# Patient Record
Sex: Male | Born: 1950 | Race: White | Hispanic: No | Smoking: Current every day smoker
Health system: Southern US, Community
[De-identification: ages and names within clinical notes are randomized; demographics above are authoritative.]

## PROBLEM LIST (undated history)

## (undated) DIAGNOSIS — J449 Chronic obstructive pulmonary disease, unspecified: Secondary | ICD-10-CM

## (undated) DIAGNOSIS — I00 Rheumatic fever without heart involvement: Secondary | ICD-10-CM

## (undated) DIAGNOSIS — I10 Essential (primary) hypertension: Secondary | ICD-10-CM

## (undated) DIAGNOSIS — J189 Pneumonia, unspecified organism: Secondary | ICD-10-CM

## (undated) HISTORY — PX: CARDIAC CATHETERIZATION: SHX172

## (undated) HISTORY — DX: Essential (primary) hypertension: I10

## (undated) HISTORY — DX: Rheumatic fever without heart involvement: I00

---

## 2009-01-19 ENCOUNTER — Observation Stay (HOSPITAL_COMMUNITY): Admission: EM | Admit: 2009-01-19 | Discharge: 2009-01-23 | Payer: Self-pay | Admitting: Emergency Medicine

## 2009-01-19 ENCOUNTER — Ambulatory Visit: Payer: Self-pay | Admitting: Family Medicine

## 2009-01-20 ENCOUNTER — Ambulatory Visit: Payer: Self-pay | Admitting: Dentistry

## 2009-01-31 ENCOUNTER — Encounter: Admission: AD | Admit: 2009-01-31 | Discharge: 2009-01-31 | Payer: Self-pay | Admitting: Dentistry

## 2009-03-02 ENCOUNTER — Ambulatory Visit: Payer: Self-pay | Admitting: Dentistry

## 2009-04-28 ENCOUNTER — Ambulatory Visit: Payer: Self-pay | Admitting: Dentistry

## 2009-06-22 ENCOUNTER — Ambulatory Visit: Payer: Self-pay | Admitting: Dentistry

## 2009-08-22 ENCOUNTER — Ambulatory Visit: Payer: Self-pay | Admitting: Dentistry

## 2010-10-22 LAB — POCT I-STAT, CHEM 8
BUN: 9 mg/dL (ref 6–23)
Calcium, Ion: 1.02 mmol/L — ABNORMAL LOW (ref 1.12–1.32)
Chloride: 98 mEq/L (ref 96–112)
Creatinine, Ser: 1.1 mg/dL (ref 0.4–1.5)
Glucose, Bld: 117 mg/dL — ABNORMAL HIGH (ref 70–99)
HCT: 46 % (ref 39.0–52.0)
Hemoglobin: 15.6 g/dL (ref 13.0–17.0)
Potassium: 3.6 mEq/L (ref 3.5–5.1)
Sodium: 131 mEq/L — ABNORMAL LOW (ref 135–145)
TCO2: 24 mmol/L (ref 0–100)

## 2010-10-22 LAB — BASIC METABOLIC PANEL
BUN: 2 mg/dL — ABNORMAL LOW (ref 6–23)
BUN: 4 mg/dL — ABNORMAL LOW (ref 6–23)
BUN: 6 mg/dL (ref 6–23)
CO2: 27 mEq/L (ref 19–32)
CO2: 27 mEq/L (ref 19–32)
Calcium: 8.4 mg/dL (ref 8.4–10.5)
Calcium: 8.6 mg/dL (ref 8.4–10.5)
Chloride: 105 mEq/L (ref 96–112)
Creatinine, Ser: 0.9 mg/dL (ref 0.4–1.5)
Creatinine, Ser: 0.91 mg/dL (ref 0.4–1.5)
Creatinine, Ser: 0.95 mg/dL (ref 0.4–1.5)
GFR calc non Af Amer: >60 mL/min (ref >60)
Glucose, Bld: 104 mg/dL — ABNORMAL HIGH (ref 70–99)
Glucose, Bld: 119 mg/dL — ABNORMAL HIGH (ref 70–99)
Potassium: 4.1 mEq/L (ref 3.5–5.1)

## 2010-10-22 LAB — COMPREHENSIVE METABOLIC PANEL
ALT: 36 U/L (ref 0–53)
BUN: 11 mg/dL (ref 6–23)
CO2: 28 mEq/L (ref 19–32)
Calcium: 8.4 mg/dL (ref 8.4–10.5)
GFR calc non Af Amer: >60 mL/min (ref >60)
Glucose, Bld: 90 mg/dL (ref 70–99)
Total Protein: 7.6 g/dL (ref 6.0–8.3)

## 2010-10-22 LAB — URINALYSIS, MICROSCOPIC ONLY
Glucose, UA: NEGATIVE mg/dL
Leukocytes, UA: NEGATIVE
Protein, ur: 30 mg/dL — AB
Specific Gravity, Urine: 1.029 (ref 1.005–1.030)
Urobilinogen, UA: 0.2 mg/dL (ref 0.0–1.0)

## 2010-10-22 LAB — CBC
HCT: 37.3 % — ABNORMAL LOW (ref 39.0–52.0)
HCT: 40 % (ref 39.0–52.0)
Hemoglobin: 13.9 g/dL (ref 13.0–17.0)
Hemoglobin: 14.9 g/dL (ref 13.0–17.0)
Hemoglobin: 15.4 g/dL (ref 13.0–17.0)
MCHC: 34.7 g/dL (ref 30.0–36.0)
MCHC: 34.8 g/dL (ref 30.0–36.0)
MCHC: 34.9 g/dL (ref 30.0–36.0)
MCV: 96.7 fL (ref 78.0–100.0)
MCV: 97.2 fL (ref 78.0–100.0)
MCV: 97.3 fL (ref 78.0–100.0)
Platelets: 361 10*3/uL (ref 150–400)
Platelets: 382 10*3/uL (ref 150–400)
Platelets: 390 10*3/uL (ref 150–400)
RBC: 4.13 MIL/uL — ABNORMAL LOW (ref 4.22–5.81)
RBC: 4.64 MIL/uL (ref 4.22–5.81)
RDW: 12.2 % (ref 11.5–15.5)
RDW: 12.7 % (ref 11.5–15.5)
RDW: 12.7 % (ref 11.5–15.5)
RDW: 12.8 % (ref 11.5–15.5)
RDW: 12.9 % (ref 11.5–15.5)

## 2010-10-22 LAB — CULTURE, BLOOD (ROUTINE X 2)
Culture: NO GROWTH
Culture: NO GROWTH

## 2010-10-22 LAB — HEMOGLOBIN A1C
Hgb A1c MFr Bld: 5.3 % (ref 4.6–6.1)
Mean Plasma Glucose: 105 mg/dL

## 2010-10-22 LAB — DIFFERENTIAL
Basophils Relative: 0 % (ref 0–1)
Basophils Relative: 0 % (ref 0–1)
Eosinophils Absolute: 0 10*3/uL (ref 0.0–0.7)
Eosinophils Absolute: 0.3 10*3/uL (ref 0.0–0.7)
Lymphocytes Relative: 7 % — ABNORMAL LOW (ref 12–46)
Lymphs Abs: 1.5 10*3/uL (ref 0.7–4.0)
Monocytes Absolute: 1.3 10*3/uL — ABNORMAL HIGH (ref 0.1–1.0)
Monocytes Relative: 9 % (ref 3–12)
Neutro Abs: 13.6 10*3/uL — ABNORMAL HIGH (ref 1.7–7.7)
Neutrophils Relative %: 80 % — ABNORMAL HIGH (ref 43–77)
Neutrophils Relative %: 87 % — ABNORMAL HIGH (ref 43–77)

## 2010-10-22 LAB — PROTIME-INR: INR: 1 (ref 0.00–1.49)

## 2010-11-28 NOTE — Discharge Summary (Signed)
Logan Green, Logan Green              ACCOUNT NO.:  [id number]   MEDICAL RECORD NO.:  [id number]          PATIENT TYPE:  OBV   LOCATION:  5155                         FACILITY:  [facility]   PHYSICIAN:  Leighton Roach McDiarmid, M.D.DATE OF BIRTH:  1951-01-23   DATE OF ADMISSION:  01/18/2009  DATE OF DISCHARGE:  01/23/2009                               DISCHARGE SUMMARY   PRIMARY CARE PHYSICIAN:  Unassigned.   CONSULTANTS:  Charlynne Pander, D.D.S. (Dental).   REASON FOR ADMISSION:  Facial cellulitis.   PRIMARY DISCHARGE DIAGNOSES:  1. Facial cellulitis.  2. Dental decay.  3. Hypertension.   NEW MEDICATIONS ON DISCHARGE:  1. Motrin 400 mg by mouth every 4 hours as needed for pain.  2. Percocet 1-2 tablets by mouth every 6 hours as needed for pain.  3. Clindamycin 300 mg by mouth 3 times a day x5 days.   PROBLEMS:  1. Facial cellulitis.  On arrival to the emergency department, the      patient had profound facial cellulitis, redness and swelling of      nose and cheeks.  This had started on January 18, 2009, per patient's      report.  On further assessment of the patient's mouth, diffuse      dental decay was noted.  The most likely cause of the facial      cellulitis is infection that seeded from dental abscess.  The      patient was started on vancomycin IV and also treated with      clindamycin IV, the cellulitis improved with this treatment.  The      patient was seen by Dr. Kristin Bruins, who took him to the operating      room and removed all teeth from upper and lower gums.  Postop, the      patient's bleeding was well controlled and the pain was well      controlled with p.r.n. Percocet.  The patient was discharged on      Motrin and percocet for pain.  The patient is to finish course of      antibiotic at home after discharge- clindamycin 300 mg by mouth 3      times a day x5 days in order to continue to treat dental abscesses      and cellulitis.  Studies performed related to this  diagnosis,      orthopantogram showed extensive caries, absent teeth and      questionable periapical abscess involving teeth #7 and #11.  The      patient is to follow up with Dr. Kristin Bruins.  He is to call tomorrow      for an appointment.  2. Hypertension.  The patient had elevations in blood pressure during      admission, on day prior to discharge had elevations into 160s and      to 180s systolic and diastolic in the low 100s.  The patient feels      strongly this is due to anxiety from his admission here in the      hospital and did not want  to take medications if he could avoid      doing so at this time.  The patient was encouraged to followup with      his family doctor to have blood pressure rechecked and to possibly      start on blood pressure medication.  The patient also was advised      to stop smoking, spoke with the patient again on the day of      discharge about smoking cessation.   FOLLOWUP APPOINTMENT:  As stated above in problem list.  The patient is  to follow up with Dr. Kristin Bruins, phone number [phone].  The patient is  to call tomorrow for a followup appointment.  The patient is also to  schedule appointment with his primary care physician.      Ellin Mayhew, MD  Electronically Signed      Leighton Roach McDiarmid, M.D.  Electronically Signed    DC/MEDQ  D:  01/23/2009  T:  01/24/2009  Job:  [id number]

## 2010-11-28 NOTE — Consult Note (Signed)
NAMEMICHALL, NOFFKE              ACCOUNT NO.:  [id number]   MEDICAL RECORD NO.:  [id number]          PATIENT TYPE:  OBV   LOCATION:  5124                         FACILITY:  [facility]   PHYSICIAN:  Charlynne Pander, D.D.S.DATE OF BIRTH:  08-19-1950   DATE OF CONSULTATION:  01/20/2009  DATE OF DISCHARGE:                                 CONSULTATION   Logan Green is a 60 year old male referred by Dr. Leveda Anna for a dental  consultation.  The patient recently admitted with facial cellulitis.  Dental consultation requested to evaluate for dental etiology and  provide treatment as indicated.Marland Kitchen   MEDICAL HISTORY:  1. Facial cellulitis.      a.     Clindamycin and vancomycin IV antibiotic therapy.      b.     Probable dental etiology.  2. Anxiety.  3. Insomnia.   ALLERGIES:  None known.   MEDICATIONS:  1. Cleocin 600 mg IV every 8 hours.  2. Vancomycin 1500 mg IV every 12 hours.  3. Heparin 5000 units every 8 hours subcutaneously.  4. Nicotine patch daily.  5. Protonix 40 mg daily.   SOCIAL HISTORY:  The patient is divorced.  The patient lives alone.  The  patient has an ex wife who lives next door.  Patient with a history of  smoking one-pack per day for many years.  Patient with a history of  drinking one to two beers per day.   FAMILY HISTORY:  Noncontributory.   FUNCTIONAL ASSESSMENT:  The patient was independent for ADLs prior to  this admission.   REVIEW OF SYSTEMS:  This was reviewed from the chart and health history  assessment form for this admission.   DENTAL HISTORY:   CHIEF COMPLAINT:  Dental consultation requested to evaluate etiology for  facial cellulitis.   HISTORY OF PRESENT ILLNESS:  The patient gives a history of sinus  congestion and headache for approximately 1 week.  The patient  subsequently had increased swelling around the nose area.  The patient  presented to the emergency room and was admitted.  The patient placed on  IV antibiotic therapy and  dental consultation then requested to evaluate  for dental etiology.   The patient currently denies acute toothaches.  Patient with probable  intraoral swelling from multiple teeth in the maxillary arch.  The  patient last saw dentist 2 months ago for comprehensive exam and x-rays.  The patient did not follow up with dental treatment due to economic  concerns.   DENTAL EXAMINATION:  GENERAL:  The patient is a well-developed, well-  nourished male in no acute distress.  VITAL SIGNS:  Blood pressure is 116/56, pulse rate is 85, respirations  are 16, temperature is 98.0.  HEAD AND NECK:  There is no palpable lymphadenopathy in the  submandibular areas. The patient denies acute TMJ symptoms.  The patient  does have a generalized erythematous rash involving the bilateral  infraorbital areas and bridge of the nose.  There is some soft tissue  swelling associated with this erythematous skin.   INTRAORAL EXAM:  Patient with normal saliva.  No significant  purulence  is noted.  Patient with some intraoral swelling involving the anterior  teeth.   DENTITION:  The patient has missing tooth numbers 1, 13, 15, 16, 17, 18,  19, 20, 21, 29, 30 and 31.  The patient has multiple retained roots in  the area tooth numbers 2, 3, 4, 6, 7, 8, 9 and 10.   PERIODONTAL:  Patient with chronic advanced periodontal disease with  plaque calculus accumulations, he has generalized gingival recession and  generalized tooth mobility.  Patient with moderate to severe bone loss.   DENTAL CARIES:  The patient has rampant dental caries involving the  remaining teeth.   CROWN OR BRIDGE:  The patient has a crown on tooth #14 which is  suboptimal.   PROSTHODONTIC:  The patient denies history of partial dentures.   OCCLUSION:  Patient with a poor occlusal scheme secondary to multiple  missing teeth, multiple retained root segments, and lack of replacement  of missing teeth with dental prostheses.   RADIOGRAPHIC  INTERPRETATION:  Patient with multiple missing teeth.  Patient with multiple retained root segments.  Patient with multiple  dental caries.  Patient with multiple areas of periapical radiolucency  at the apices.  Patient with a poor occlusal scheme.   ASSESSMENT:  1. Chronic periodontitis with bone loss.  2. Plaque and calculus accumulations.  3. Generalized gingival recession.  4. Generalized tooth mobility.  5. Rampant dental caries.  6. Multiple areas of periapical pathology.  7. Multiple missing teeth.  8. Multiple retained root segments.  9. Poor occlusal scheme.  10.No history of partial dentures.  11.History of oral neglect.   PLAN/RECOMMENDATIONS:  1. I discussed the risks, benefits, and complications of various      treatment option with the patient in relationship to his medical      and dental conditions.  We discussed various treatment options to      include no treatment, total and subtotal extractions with      alveoloplasty, pre prosthetic surgery as indicated, periodontal      therapy, dental restorations, root canal therapy, crown or bridge      therapy, implant therapy, and replacing the missing teeth as      indicated after adequate healing.  After thorough discussion of the      choice of possible upper denture against lower partial denture and      upper denture against lower denture, the patient wishes to proceed      with extraction of all remaining teeth with alveoloplasty in the      operating room with general anesthesia.  The patient will then      follow with a dentist of his choice for fabrication of upper and      lower complete dentures after adequate healing.  Currently, the      operating room procedure scheduled for Friday, 7:30 in the morning.      The patient in the meantime will be continued on IV antibiotic      therapy as indicated per the medical team.  2. Discussion of findings with the medical team as indicated.      Charlynne Pander, D.D.S.  Electronically Signed     RFK/MEDQ  D:  01/20/2009  T:  01/21/2009  Job:  [id number]   cc:   William A. Leveda Anna, M.D.  Ellin Mayhew, MD

## 2010-11-28 NOTE — H&P (Signed)
Logan Green, Logan Green              ACCOUNT NO.:  [id number]   MEDICAL RECORD NO.:  [id number]          PATIENT TYPE:  OBV   LOCATION:  5124                         FACILITY:  [facility]   PHYSICIAN:  Logan Green, Logan GreenDATE OF BIRTH:  07/25/1950   DATE OF ADMISSION:  01/18/2009  DATE OF DISCHARGE:                              HISTORY & PHYSICAL   PRIMARY CARE Logan Green:  Dr. Merla Riches at [facility].   CHIEF COMPLAINT:  Fever/infection.   HISTORY OF PRESENT ILLNESS:  A 60 year old male with a one-week history  of generalized headache, sinus congestion and sore throat, who presents  with a one-day history of redness, swelling over the nose and maxillary  sinus area.  The patient stated his symptoms began about a week ago,  which fall more in line with the symptoms of a sinus infection.  The  patient does report having subjective fevers and chills over the last  week, as well as a nonproductive cough at night and in the morning.  The  patient also reports having decreased p.o. intake, decreased hearing in  the left ear.  The patient denies any nausea, vomiting, diarrhea or  constipation and the patient does report intermittent muscle weakness  over the past week, as well.  The patient reports that the facial  redness and swelling appear to have occurred rapidly over the previous  24-36 hours before presentation, per the patient.  The patient also  reports, over the past 1-2 days, pain with swallowing, as well as  fullness in the posterior mouth.  Upon admission, the patient was found  to have a white count of 22.1 with a differential of 87% neutrophils.  A  maxillofacial CT was also taken, which showed positive facial soft-  tissue swelling, which was likely due to cellulitis, per radiology, with  no abscess, as well as ethmoid sinusitis.   PAST MEDICAL HISTORY:  1. Anxiety.  2. Insomnia.   PAST SURGICAL HISTORY:  None.   SOCIAL HISTORY:  The patient reports being divorced  and living alone.  However, his divorced wife lives right next-door.  The patient does  report a one-pack-per-day smoking history, as well as drinking one to  two beers per day.  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   FAMILY HISTORY:  Noncontributory.   ALLERGIES:  No known drug allergies.   MEDICATIONS:  Xanax, Ambien.   REVIEW OF SYSTEMS:  Negative, except as noted above in the HPI.   PHYSICAL EXAM:  VITAL SIGNS:  Temperature 98.1, pulse 95, respirations  18, blood pressure 110/77, satting greater than 96% on room air.  GENERAL:  Alert and oriented x3, no acute distress.  HEENT:  Pupils were equal, round and reactive to light.  Extraocular  movements intact with no obvious erythema or inflammation in the orbital  sockets bilaterally.  Marked erythema and swelling of the nasal, as well  as the paranasal area with positive erythema, as well as positive  erythema in the left-sided upper and posterior pharynx, as  well as poor  dentition.  NECK:  Positive cervical lymphadenopathy bilaterally.  CARDIOVASCULAR:  Regular rate and rhythm, no rubs, gallops or murmurs.  LUNGS:  Clear to auscultation bilaterally, no wheezes, rales or rhonchi  noted.  ABDOMEN:  Soft, nontender, not distended.  EXTREMITIES:  Two-plus peripheral pulses with no edema.  NEUROLOGIC:  Cranial nerves II through XII grossly intact.  MUSCULOSKELETAL:  Normal strength throughout.   LABS AND STUDIES:  White count 22.1, hemoglobin 15.4, hematocrit 44.3,  platelets 367.  Differential:  87% neutrophils, 7% lymphs, 6% monos,  absolute neutrophil count 19.3, absolute lymphocyte count 1.5, absolute  monocyte count 1.3.  BMP:  Sodium 131, potassium 3.6, chloride 98,  bicarb 24, BUN 9, creatinine 1.1, glucose 117.   RADIOGRAPHIC FINDINGS:  Maxillofacial CT:  Positive facial soft-tissue  swelling, likely due to cellulitis.  No  abscesses noted, as well as  ethmoid sinusitis.   ASSESSMENT AND PLAN:  68. A 60 year old male with likely facial cellulitis.  Facial rash.      The current working diagnosis is facial cellulitis versus      peritonsillar abscess, given the history and physical exam.  It is      in conjunction with radiological imaging.  Facial cellulitis is      likely; however, given the patient's poor dentition, peritonsillar      abscess also is clinically indicated.  The patient will be given      bacterial coverage with vancomycin and clindamycin per pharmacy.      Blood cultures will be obtained prior to starting antibiotics.  The      facial rash has been demarcated upon presentation for further      monitoring of the progression of the rash.  We will monitor for      worsening symptoms overnight.  We will also provide Tylenol for      p.r.n. pain management as indicated and we may obtain an ENT or      oral surgery consult for further evaluation if in the likelihood,      peritonsillar abscess is clinically indicated.  2. FEN/GI:  Clear liquids, hep-lock IV.  3. Prophylaxis:  Heparin t.i.d., Protonix.  4. Disposition:  Pending clinical outcome with antibiotics.  5. Tobacco:  Nicotine patch, as well as smoking cessation.  6. Ethanol:  We will monitor for signs or symptoms of ethanol      withdrawal per CIWA protocol.      Logan Albee, MD  Electronically Signed      Logan Green. Logan Green, M.D.  Electronically Signed    SN/MEDQ  D:  01/19/2009  T:  01/19/2009  Job:  [id number]

## 2010-11-28 NOTE — Op Note (Signed)
NAMEFRANSISCO, Logan Green              ACCOUNT NO.:  [id number]   MEDICAL RECORD NO.:  [id number]          PATIENT TYPE:  OBV   LOCATION:  5155                         FACILITY:  [facility]   PHYSICIAN:  Charlynne Pander, D.D.S.DATE OF BIRTH:  1951/01/02   DATE OF PROCEDURE:  01/21/2009  DATE OF DISCHARGE:                               OPERATIVE REPORT   PREOPERATIVE DIAGNOSES:  1. Facial swelling.  2. Leukocytosis.  3. Apical periodontitis.  4. Chronic periodontitis.  5. Rampant dental caries.  6. Multiple retained root segments.   POSTOPERATIVE DIAGNOSES:  1. Facial swelling.  2. Leukocytosis.  3. Apical periodontitis.  4. Chronic periodontitis.  5. Rampant dental caries.  6. Multiple retained root segments.   OPERATIONS:  1. Extraction of remaining teeth (tooth numbers 2, 3, 4, 6, 7, 8, 9,      10, 11, 12, 14, 16, 22, 23, 24, 25, 26, 27, 28, and 32).  2. Four quadrants of alveoloplasty.   SURGEON:  Charlynne Pander, D.D.S.   ASSISTANT:  Public house manager (Sales executive).   ANESTHESIA:  General anesthesia via nasal endotracheal tube.  Dr. Michelle Piper,  attending.   MEDICATIONS:  1. IV antibiotic therapy as per previous inpatient orders.  2. Local anesthesia with a total utilization of 6 carpules, each      containing 34 mg of lidocaine with 0.017 mg of epinephrine as well      as 2 carpules, each containing 9 mg of bupivacaine with 0.009 mg of      epinephrine.   SPECIMENS:  There were 20 teeth that were discarded.   DRAINS:  None.   CULTURES:  None.   COMPLICATIONS:  None.   ESTIMATED BLOOD LOSS:  100 mL.   FLUIDS:  1550 mL of lactated Ringer solution.   INDICATIONS:  The patient was recently admitted with a history of facial  swelling and leukocytosis.  The patient was placed on IV antibiotic  therapy.  Dental consultation requested to evaluate and rule out dental  etiology and provide dental treatment is indicated.  The patient was  examined and treatment  planned for extraction of remaining teeth with  alveoloplasty as indicated.  This treatment plan was formulated to  decrease the risks and complications associated with dental infection  from further affecting the patient's systemic health.   OPERATIVE FINDINGS:  The patient was examined in operating room #2.  The  teeth were identified for extraction.  The patient noted be affected by  chronic periodontitis, apical periodontitis, retained root segments, and  rampant dental caries.  The aforementioned necessitated the removal of  all remaining teeth with alveoloplasty as indicated.   DESCRIPTION OF PROCEDURE:  The patient was brought to the main operating  room #2.  The patient was then placed in the supine position on the  operating room table.  General anesthesia was then induced via nasal  endotracheal tube.  The patient was then prepped and draped in the usual  manner for dental medicine procedure.  A time-out was performed.  The  patient was identified and procedures were verified.  A throat pack was  placed at this time.  The oral cavity was then thoroughly examined with  findings as noted above.  The patient was then ready for the dental  medicine procedure as follows:   Local anesthesia was administered sequentially with a total utilization  of 6 carpules, each containing 34 mg of lidocaine with 0.017 mg of  epinephrine as well as 2 carpules, each containing 9 mg of bupivacaine  with 0.009 mg of epinephrine.   The maxillary left and right quadrants were first approached.  Anesthesia was delivered via infiltration to the maxillary arches.  At  this point in time, the maxillary right quadrant was first approached,  15-blade incision was then made from the maxillary right tuberosity and  extended to the mesial #11.  Surgical flap was then carefully reflected.  Appropriate amounts of buccal and interseptal bone were removed around  tooth numbers 2, 3, 4, and 6 appropriately.  The  teeth were then  subluxated with a series of straight elevators.  Tooth numbers 2, 3, 4,  6, 7, 8, 9, 10 were then removed with a 150 forceps without  complications.  Alveoloplasty was then performed utilizing rongeurs and  bone file.  The tissues were then approximated and trimmed  appropriately.  The surgical site was then irrigated with copious  amounts of sterile saline.  The surgical site was then closed from the  maxillary right tuberosity and extended to the mesial #8 utilizing 3-0  chromic gut suture in a continuous interrupted suture technique x1.   At this point in time, the maxillary left quadrant was approached.  A 15  blade incision was then made from the maxillary left tuberosity and  extended to the mesial #11.  The surgical flap was then carefully  reflected.  Buccal and interseptal bone were then removed around tooth  numbers 14, 12, and 11 appropriately.  These teeth were then subluxated  with a series of straight elevators.  The retained root in the area of  #16 was then removed with rongeurs without complications.  Tooth #14 was  then removed with a 53-L forceps without complications.  A 151 forceps  was then utilized to remove tooth numbers #11 and #12 without  complications.  At this point in time, alveoloplasty was then performed  utilizing rongeurs and bone file.  The soft tissues were approximated  and trimmed appropriately.  The surgical site was then irrigated with  copious amounts of sterile saline.  The surgical site was then closed  from the maxillary left tuberosity and extended to the mesial of #9  utilizing 3-0 chromic gut suture in a continuous interrupted suture  technique x1.  Two individual interrupted sutures were then placed to  further close the surgical site.   At this point in time, the mandibular quadrants were approached.  The  patient was anesthetized utilizing 2 carpules, each containing 9 mg of  bupivacaine with epinephrine via inferior  alveolar nerve blocks.  Further infiltration was then achieved utilizing the lidocaine with  epinephrine.  A 15 blade incision was then made from the distal #19 and  extended to the distal #32.  A surgical flap was then carefully  reflected.  Appropriate amounts of buccal and interseptal bone were  removed around tooth numbers 22, 27, 28, and 32 appropriately.  The  lower teeth were then subluxated with a series of straight elevators.  Tooth numbers 22, 23, 24, 25, 26, 27, 28 were then removed with a 151  forceps without complications.  Tooth #32 was then removed with a 17  forceps without complications.  Alveoloplasty was then performed  utilizing rongeurs and bone file.  The surgical sites were then  irrigated with copious amounts of sterile saline x2.  The tissues were  then approximated and trimmed appropriately.  The surgical site was then  closed from the distal #19 and extended to the mesial #24 utilizing 3-0  chromic gut suture in a continuous interrupted suture technique x1.  One  individual interrupted sutures was then placed to further close the  surgical site.  The mandibular right quadrant was then closed from the  distal #32 and extended to the mesial #25 utilizing 3-0 chromic gut  suture in a continuous interrupted suture technique x1.   At this point in time, the entire mouth was irrigated with copious  amounts of sterile saline.  The patient was examined for complications,  seeing none, dental medicine procedure was deemed to be complete.  The  throat pack was removed at this time.  A series of 4 x 4 gauzes were  placed in the mouth to aid hemostasis.  The patient was then handed over  to the Anesthesia Team for final disposition.  After appropriate amount  of time, the patient was extubated and taken to the post anesthesia care  unit with stable vital signs and good oxygenation level.  All counts  were correct for the dental medicine and procedure.  The patient will   most likely be kept overnight and then discharged as indicated.  The  patient will be seen in approximately 1 week for evaluation and for  suture removal in Dental Medicine.  The patient is to call for an  appointment.      Charlynne Pander, D.D.S.  Electronically Signed     RFK/MEDQ  D:  01/21/2009  T:  01/22/2009  Job:  [id number]   cc:   Ellin Mayhew, MD  Santiago Bumpers. Leveda Anna, M.D.

## 2012-12-20 ENCOUNTER — Ambulatory Visit: Payer: Self-pay | Admitting: Family Medicine

## 2012-12-20 VITALS — BP 132/78 | HR 80 | Temp 98.0°F | Resp 17 | Ht 68.0 in | Wt 180.0 lb

## 2012-12-20 DIAGNOSIS — M25569 Pain in unspecified knee: Secondary | ICD-10-CM

## 2012-12-20 DIAGNOSIS — M1990 Unspecified osteoarthritis, unspecified site: Secondary | ICD-10-CM

## 2012-12-20 DIAGNOSIS — M129 Arthropathy, unspecified: Secondary | ICD-10-CM

## 2012-12-20 MED ORDER — CYCLOBENZAPRINE HCL 5 MG PO TABS: 5.0000 mg | ORAL_TABLET | Freq: Every evening | ORAL | Status: DC | PRN

## 2012-12-20 MED ORDER — TRAMADOL HCL 50 MG PO TABS: 50.0000 mg | ORAL_TABLET | Freq: Three times a day (TID) | ORAL | Status: DC | PRN

## 2012-12-20 MED ORDER — MELOXICAM 7.5 MG PO TABS: ORAL_TABLET | ORAL | Status: DC

## 2012-12-20 NOTE — Progress Notes (Signed)
[facility]:  Office Visit  Chief Complaint:  Chief Complaint  Patient presents with  . Knee Pain    swelling and pain with fluid     HPI: Logan Green is a 62 y.o. male who complains of bilateral knee pain but today he is here for his right knee. He thinks he may need to have fluid removed form it and get an "antibiotic shot" to make his knees feel better. He has pain and swelling with stairs and also being on his feet on concrete or hard surfaces for longs periods. It has been exacerbated in the last 3 weeks since he stared a new job. The swelling comes and goes. The knee pain is throbbing, aching, intermittent usually worse in the Am after waking up or with prolong sitting and when he moves it is much improved. He has had injuries to knee before but no surgeries. 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 He is working with a lot of younger guys and is trying to keep up, some of them have not been nice to him and is complaining of their backs hurting and so he ends up doing more lifting/carrying and heavy work, which hurts his knees. He does not want to complain because he is new on the job. He was last employed in 2008, he was let go from his job due to his age. They had let go some of the older guys per patient. He was with the company for a long time. He has been out of work since 2008. No family h/o RA  History reviewed. No pertinent past medical history. History reviewed. No pertinent past surgical history. History   Social History  . Marital Status: Married    Spouse Name: N/A    Number of Children: N/A  . Years of Education: N/A   Social History Main Topics  . Smoking status: Current Every Day Smoker -- 1.00 packs/day for 41 years    Types: Cigarettes  . Smokeless tobacco: None  . Alcohol Use: Yes  . Drug Use: No  . Sexually Active: Yes    Birth Control/ Protection: None   Other Topics  Concern  . None   Social History Narrative  . None   History reviewed. No pertinent family history. No Known Allergies Prior to Admission medications   Not on File     ROS: The patient denies fevers, chills, night sweats, unintentional weight loss, chest pain, palpitations, wheezing, dyspnea on exertion, nausea, vomiting, abdominal pain, dysuria, hematuria, melena, numbness, weakness, or tingling.  All other systems have been reviewed and were otherwise negative with the exception of those mentioned in the HPI and as above.    PHYSICAL EXAM: Filed Vitals:   12/20/12 1700  BP: 132/78  Pulse: 80  Temp: 98 F (36.7 C)  Resp: 17   Filed Vitals:   12/20/12 1700  Height: 5\' 8"  (1.727 m)  Weight: 180 lb (81.647 kg)   Body mass index is 27.38 kg/(m^2).  General: Alert, no acute distress HEENT:  Normocephalic, atraumatic, oropharynx patent.  Cardiovascular:  Regular rate and rhythm, no rubs murmurs or gallops.  No Carotid bruits, radial pulse intact. No pedal edema.  Respiratory: Clear to auscultation bilaterally.  No wheezes, rales, or rhonchi.  No cyanosis, no use of accessory musculature GI: No organomegaly, abdomen is soft and non-tender, positive bowel sounds.  No masses. Skin: No rashes.  Neurologic: Facial musculature symmetric. Psychiatric: Patient is appropriate throughout our interaction. Lymphatic: No cervical lymphadenopathy Musculoskeletal: Gait intact. + bilateral knee crepitus, neg McMurray, neg Lachman, neg effusion, stable to varus and valgus stress  Fulll ROM, 5/5 strength, 2/2 DTRs  LABS:    EKG/XRAY:   Primary read interpreted by Dr. Conley Rolls at [facility].   ASSESSMENT/PLAN: Encounter Diagnoses  Name Primary?  . Acute knee pain, unspecified laterality Yes  . Arthritis    [name] is an extremely pleasant man who is here for bilateral knee pain, swelling, right worse than left He is not insured Just started a new job 3 weeks ago after bein unemployed  since 2008 He has arthritis and based on hx flares up after a long day of heavy lifting and manual labor and being on feet I made him teary eyed when I told him that his knees are not a young man's knees when he told me he is trying to keep up with the young guys at his work, I apologized for making him tearful. He accepted my apology He has 62 y/o knees and they have wear and tear to them I would not recommend xrays unles he wanted them since there has been no injury or trauma to them. There is no need for aspiration or injection since he has no fluid. I will sx treat with flexeril, mobic and tramadol If no improvement then consider xrays and steroid injections Gross sideeffects, risk and benefits, and alternatives of medications d/w patient. Patient is aware that all medications have potential sideeffects and we are unable to predict every sideeffect or drug-drug interaction that may occur.        LE, THAO PHUONG, DO 12/21/2012 9:02 AM

## 2012-12-20 NOTE — Patient Instructions (Addendum)

## 2016-04-09 ENCOUNTER — Ambulatory Visit (INDEPENDENT_AMBULATORY_CARE_PROVIDER_SITE_OTHER): Payer: Self-pay | Admitting: Family Medicine

## 2016-04-09 VITALS — BP 140/70 | HR 98 | Temp 98.2°F | Resp 17 | Ht 68.0 in | Wt 185.0 lb

## 2016-04-09 DIAGNOSIS — Z1329 Encounter for screening for other suspected endocrine disorder: Secondary | ICD-10-CM

## 2016-04-09 DIAGNOSIS — Z13220 Encounter for screening for lipoid disorders: Secondary | ICD-10-CM

## 2016-04-09 DIAGNOSIS — Z23 Encounter for immunization: Secondary | ICD-10-CM

## 2016-04-09 DIAGNOSIS — I10 Essential (primary) hypertension: Secondary | ICD-10-CM

## 2016-04-09 MED ORDER — LOSARTAN POTASSIUM 50 MG PO TABS: 50.0000 mg | ORAL_TABLET | Freq: Every day | ORAL | 0 refills | Status: DC

## 2016-04-09 NOTE — Patient Instructions (Addendum)
Start Losartan 50 mg once daily for hypertension management.  Return for follow-up in 6 weeks for blood pressure check.  I will contact you regarding your lab results.  IF you received an x-ray today, you will receive an invoice from [facility]. Please contact [facility] at [phone] with questions or concerns regarding your invoice.   IF you received labwork today, you will receive an invoice from [employer]. Please contact [facility] at [phone] with questions or concerns regarding your invoice.   Our billing staff will not be able to assist you with questions regarding bills from these companies.  You will be contacted with the lab results as soon as they are available. The fastest way to get your results is to activate your My Chart account. Instructions are located on the last page of this paperwork. If you have not heard from [place] regarding the results in 2 weeks, please contact this office.    Smoking Cessation, Tips for Success If you are ready to quit smoking, congratulations! You have chosen to help yourself be healthier. Cigarettes bring nicotine, tar, carbon monoxide, and other irritants into your body. Your lungs, heart, and blood vessels will be able to work better without these poisons. There are many different ways to quit smoking. Nicotine gum, nicotine patches, a nicotine inhaler, or nicotine nasal spray can help with physical craving. Hypnosis, support groups, and medicines help break the habit of smoking. WHAT THINGS CAN I DO TO MAKE QUITTING EASIER?  Here are some tips to help you quit for good:  Pick a date when you will quit smoking completely. Tell all of your friends and family about your plan to quit on that date.  Do not try to slowly cut down on the number of cigarettes you are smoking. Pick a quit date and quit smoking completely starting on that day.  Throw away all cigarettes.   Clean and remove all ashtrays  from your home, work, and car.  On a card, write down your reasons for quitting. Carry the card with you and read it when you get the urge to smoke.  Cleanse your body of nicotine. Drink enough water and fluids to keep your urine clear or pale yellow. Do this after quitting to flush the nicotine from your body.  Learn to predict your moods. Do not let a bad situation be your excuse to have a cigarette. Some situations in your life might tempt you into wanting a cigarette.  Never have "just one" cigarette. It leads to wanting another and another. Remind yourself of your decision to quit.  Change habits associated with smoking. If you smoked while driving or when feeling stressed, try other activities to replace smoking. Stand up when drinking your coffee. Brush your teeth after eating. Sit in a different chair when you read the paper. Avoid alcohol while trying to quit, and try to drink fewer caffeinated beverages. Alcohol and caffeine may urge you to smoke.  Avoid foods and drinks that can trigger a desire to smoke, such as sugary or spicy foods and alcohol.  Ask people who smoke not to smoke around you.  Have something planned to do right after eating or having a cup of coffee. For example, plan to take a walk or exercise.  Try a relaxation exercise to calm you down and decrease your stress. Remember, you may be tense and nervous for the first 2 weeks after you quit, but this will pass.  Find new activities to keep  your hands busy. Play with a pen, coin, or rubber band. Doodle or draw things on paper.  Brush your teeth right after eating. This will help cut down on the craving for the taste of tobacco after meals. You can also try mouthwash.   Use oral substitutes in place of cigarettes. Try using lemon drops, carrots, cinnamon sticks, or chewing gum. Keep them handy so they are available when you have the urge to smoke.  When you have the urge to smoke, try deep breathing.  Designate  your home as a nonsmoking area.  If you are a heavy smoker, ask your health care provider about a prescription for nicotine chewing gum. It can ease your withdrawal from nicotine.  Reward yourself. Set aside the cigarette money you save and buy yourself something nice.  Look for support from others. Join a support group or smoking cessation program. Ask someone at home or at work to help you with your plan to quit smoking.  Always ask yourself, "Do I need this cigarette or is this just a reflex?" Tell yourself, "Today, I choose not to smoke," or "I do not want to smoke." You are reminding yourself of your decision to quit.  Do not replace cigarette smoking with electronic cigarettes (commonly called e-cigarettes). The safety of e-cigarettes is unknown, and some may contain harmful chemicals.  If you relapse, do not give up! Plan ahead and think about what you will do the next time you get the urge to smoke. HOW WILL I FEEL WHEN I QUIT SMOKING? You may have symptoms of withdrawal because your body is used to nicotine (the addictive substance in cigarettes). You may crave cigarettes, be irritable, feel very hungry, cough often, get headaches, or have difficulty concentrating. The withdrawal symptoms are only temporary. They are strongest when you first quit but will go away within 10-14 days. When withdrawal symptoms occur, stay in control. Think about your reasons for quitting. Remind yourself that these are signs that your body is healing and getting used to being without cigarettes. Remember that withdrawal symptoms are easier to treat than the major diseases that smoking can cause.  Even after the withdrawal is over, expect periodic urges to smoke. However, these cravings are generally short lived and will go away whether you smoke or not. Do not smoke! WHAT RESOURCES ARE AVAILABLE TO HELP ME QUIT SMOKING? Your health care provider can direct you to community resources or hospitals for support,  which may include:  Group support.  Education.  Hypnosis.  Therapy.   This information is not intended to replace advice given to you by your health care provider. Make sure you discuss any questions you have with your health care provider.   Document Released: 03/30/2004 Document Revised: 07/23/2014 Document Reviewed: 12/18/2012 Elsevier Interactive Patient Education 2016 [employer].  Hypertension Hypertension, commonly called high blood pressure, is when the force of blood pumping through your arteries is too strong. Your arteries are the blood vessels that carry blood from your heart throughout your body. A blood pressure reading consists of a higher number over a lower number, such as 110/72. The higher number (systolic) is the pressure inside your arteries when your heart pumps. The lower number (diastolic) is the pressure inside your arteries when your heart relaxes. Ideally you want your blood pressure below 120/80. Hypertension forces your heart to work harder to pump blood. Your arteries may become narrow or stiff. Having untreated or uncontrolled hypertension can cause heart attack, stroke, kidney disease,  and other problems. RISK FACTORS Some risk factors for high blood pressure are controllable. Others are not.  Risk factors you cannot control include:   Race. You may be at higher risk if you are African American.  Age. Risk increases with age.  Gender. Men are at higher risk than women before age 82 years. After age 84, women are at higher risk than men. Risk factors you can control include:  Not getting enough exercise or physical activity.  Being overweight.  Getting too much fat, sugar, calories, or salt in your diet.  Drinking too much alcohol. SIGNS AND SYMPTOMS Hypertension does not usually cause signs or symptoms. Extremely high blood pressure (hypertensive crisis) may cause headache, anxiety, shortness of breath, and nosebleed. DIAGNOSIS To check if you  have hypertension, your health care provider will measure your blood pressure while you are seated, with your arm held at the level of your heart. It should be measured at least twice using the same arm. Certain conditions can cause a difference in blood pressure between your right and left arms. A blood pressure reading that is higher than normal on one occasion does not mean that you need treatment. If it is not clear whether you have high blood pressure, you may be asked to return on a different day to have your blood pressure checked again. Or, you may be asked to monitor your blood pressure at home for 1 or more weeks. TREATMENT Treating high blood pressure includes making lifestyle changes and possibly taking medicine. Living a healthy lifestyle can help lower high blood pressure. You may need to change some of your habits. Lifestyle changes may include:  Following the DASH diet. This diet is high in fruits, vegetables, and whole grains. It is low in salt, red meat, and added sugars.  Keep your sodium intake below 2,300 mg per day.  Getting at least 30-45 minutes of aerobic exercise at least 4 times per week.  Losing weight if necessary.  Not smoking.  Limiting alcoholic beverages.  Learning ways to reduce stress. Your health care provider may prescribe medicine if lifestyle changes are not enough to get your blood pressure under control, and if one of the following is true:  You are 91-80 years of age and your systolic blood pressure is above 140.  You are 49 years of age or older, and your systolic blood pressure is above 150.  Your diastolic blood pressure is above 90.  You have diabetes, and your systolic blood pressure is over 140 or your diastolic blood pressure is over 90.  You have kidney disease and your blood pressure is above 140/90.  You have heart disease and your blood pressure is above 140/90. Your personal target blood pressure may vary depending on your medical  conditions, your age, and other factors. HOME CARE INSTRUCTIONS  Have your blood pressure rechecked as directed by your health care provider.   Take medicines only as directed by your health care provider. Follow the directions carefully. Blood pressure medicines must be taken as prescribed. The medicine does not work as well when you skip doses. Skipping doses also puts you at risk for problems.  Do not smoke.   Monitor your blood pressure at home as directed by your health care provider. SEEK MEDICAL CARE IF:   You think you are having a reaction to medicines taken.  You have recurrent headaches or feel dizzy.  You have swelling in your ankles.  You have trouble with your vision. SEEK  IMMEDIATE MEDICAL CARE IF:  You develop a severe headache or confusion.  You have unusual weakness, numbness, or feel faint.  You have severe chest or abdominal pain.  You vomit repeatedly.  You have trouble breathing. MAKE SURE YOU:   Understand these instructions.  Will watch your condition.  Will get help right away if you are not doing well or get worse.   This information is not intended to replace advice given to you by your health care provider. Make sure you discuss any questions you have with your health care provider.   Document Released: 07/02/2005 Document Revised: 11/16/2014 Document Reviewed: 04/24/2013 Elsevier Interactive Patient Education [employer]2016 Elsevier Inc.  c

## 2016-04-09 NOTE — Progress Notes (Signed)
   Patient ID: Logan Green, male    DOB: [birth year], 65 y.o.   MRN: [id number]  PCP: No PCP Per Patient  Chief Complaint  Patient presents with  . form to fill out of gun permit    bp is elevated    Subjective:   HPI 65 year old presents for evaluation of hypertension and needing a letter reporting that he had a medical exam completed in order to obtain a gun permit.  Patient is a current smoker and has smoked over 40 years. Attempted to get weapons permit and he needs a medical release form completed. He has no form to complete and needs a release form completed. He reports years of having episodes of elevated blood pressure. He hasn't had health insurance since he retired and therefore hasn't received any follow-up evaluation for hypertension. He denies chest pain, palpitations, or shortness of breath.  Social History   Social History  . Marital status: Married    Spouse name: N/A  . Number of children: N/A  . Years of education: N/A   Occupational History  . Not on file.   Social History Main Topics  . Smoking status: Current Every Day Smoker    Packs/day: 1.00    Years: 41.00    Types: Cigarettes  . Smokeless tobacco: Not on file  . Alcohol use Yes  . Drug use: No  . Sexual activity: Yes    Birth control/ protection: None   Other Topics Concern  . Not on file   Social History Narrative  . No narrative on file   No family history on file.  Review of Systems See HPI  There are no active problems to display for this patient.    Prior to Admission medications   Medication Sig Start Date End Date Taking? Authorizing Provider  ibuprofen (ADVIL,MOTRIN) 100 MG tablet Take 100 mg by mouth every 6 (six) hours as needed for fever.   Yes Historical Provider, MD  cyclobenzaprine (FLEXERIL) 5 MG tablet Take 1 tablet (5 mg total) by mouth at bedtime as needed for muscle spasms. Patient not taking: Reported on 04/09/2016 12/20/12   Thao P Le, DO  meloxicam (MOBIC) 7.5  MG tablet May take 2 pills PO daily prn pain, no other NSAIDs Patient not taking: Reported on 04/09/2016 12/20/12   Thao P Le, DO  traMADol (ULTRAM) 50 MG tablet Take 1 tablet (50 mg total) by mouth every 8 (eight) hours as needed for pain. Only for severe pain Patient not taking: Reported on 04/09/2016 12/20/12   Thao P Le, DO   No Known Allergies     Objective:  Physical Exam    Vitals:   04/09/16 1609 04/09/16 1627  BP: (!) 160/88 140/70  Pulse: 98   Resp: 17   Temp: 98.2 F (36.8 C)    Assessment & Plan:  1. Essential hypertension, uncontrolled. - COMPLETE METABOLIC PANEL WITH GFR - CBC with Differential/Platelet  Plan: Start Losartan 50 mg once daily. Return in 6 weeks for blood pressure recheck.  2. Screening, lipid - Lipid panel  3. Screening for thyroid disorder - TSH  Follow-up as needed. Letter provided for gun permit request indicated patient has received a medical evaluation.  Godfrey PickKimberly S. Tiburcio PeaHarris, MSN, FNP-C [facility]

## 2016-04-10 LAB — CBC WITH DIFFERENTIAL/PLATELET
BASOS ABS: 0 {cells}/uL (ref 0–200)
Basophils Relative: 0 %
EOS PCT: 6 %
Eosinophils Absolute: 582 cells/uL — ABNORMAL HIGH (ref 15–500)
HCT: 49.7 % (ref 38.5–50.0)
HEMOGLOBIN: 17.2 g/dL — AB (ref 13.2–17.1)
LYMPHS ABS: 2522 {cells}/uL (ref 850–3900)
Lymphocytes Relative: 26 %
MCH: 34.1 pg — AB (ref 27.0–33.0)
MCHC: 34.6 g/dL (ref 32.0–36.0)
MCV: 98.6 fL (ref 80.0–100.0)
MPV: 9.7 fL (ref 7.5–12.5)
Monocytes Absolute: 970 cells/uL — ABNORMAL HIGH (ref 200–950)
Monocytes Relative: 10 %
NEUTROS PCT: 58 %
Neutro Abs: 5626 cells/uL (ref 1500–7800)
Platelets: 264 10*3/uL (ref 140–400)
RBC: 5.04 MIL/uL (ref 4.20–5.80)
RDW: 13.4 % (ref 11.0–15.0)
WBC: 9.7 10*3/uL (ref 3.8–10.8)

## 2016-04-10 LAB — COMPLETE METABOLIC PANEL WITH GFR
ALBUMIN: 4.3 g/dL (ref 3.6–5.1)
ALK PHOS: 56 U/L (ref 40–115)
ALT: 15 U/L (ref 9–46)
AST: 23 U/L (ref 10–35)
BILIRUBIN TOTAL: 0.5 mg/dL (ref 0.2–1.2)
BUN: 7 mg/dL (ref 7–25)
CO2: 21 mmol/L (ref 20–31)
CREATININE: 0.96 mg/dL (ref 0.70–1.25)
Calcium: 9.5 mg/dL (ref 8.6–10.3)
Chloride: 101 mmol/L (ref 98–110)
GFR, Est African American: >89 mL/min (ref >=60)
GFR, Est Non African American: 84 mL/min (ref >=60)
GLUCOSE: 96 mg/dL (ref 65–99)
POTASSIUM: 3.8 mmol/L (ref 3.5–5.3)
SODIUM: 137 mmol/L (ref 135–146)
TOTAL PROTEIN: 7.5 g/dL (ref 6.1–8.1)

## 2016-04-10 LAB — TSH: TSH: 0.7 mIU/L (ref 0.40–4.50)

## 2016-04-10 LAB — LIPID PANEL
CHOLESTEROL: 228 mg/dL — AB (ref 125–200)
HDL: 43 mg/dL (ref >=40)
LDL CALC: 143 mg/dL — AB (ref <130)
Total CHOL/HDL Ratio: 5.3 Ratio — ABNORMAL HIGH (ref <=5.0)
Triglycerides: 211 mg/dL — ABNORMAL HIGH (ref <150)
VLDL: 42 mg/dL — AB (ref <30)

## 2016-04-11 ENCOUNTER — Encounter: Payer: Self-pay | Admitting: Family Medicine

## 2016-04-11 ENCOUNTER — Telehealth: Payer: Self-pay | Admitting: Family Medicine

## 2016-04-11 DIAGNOSIS — I10 Essential (primary) hypertension: Secondary | ICD-10-CM | POA: Insufficient documentation

## 2016-04-11 NOTE — Progress Notes (Signed)
April 11, 2016   [place] [id number]   Dear Logan Green,  Below are the results from your recent visit and your results were as expected.  Resulted Orders  COMPLETE METABOLIC PANEL WITH GFR  Result Value Ref Range   Sodium 137 135 - 146 mmol/L   Potassium 3.8 3.5 - 5.3 mmol/L   Chloride 101 98 - 110 mmol/L   CO2 21 20 - 31 mmol/L   Glucose, Bld 96 65 - 99 mg/dL   BUN 7 7 - 25 mg/dL   Creat 0.96 0.70 - 1.25 mg/dL     Comment:       For patients > or = 63 years of age: The upper reference limit for Creatinine is approximately 13% higher for people identified as African-American.      Total Bilirubin 0.5 0.2 - 1.2 mg/dL   Alkaline Phosphatase 56 40 - 115 U/L   AST 23 10 - 35 U/L   ALT 15 9 - 46 U/L   Total Protein 7.5 6.1 - 8.1 g/dL   Albumin 4.3 3.6 - 5.1 g/dL   Calcium 9.5 8.6 - 10.3 mg/dL   GFR, Est African American >89 >=60 mL/min   GFR, Est Non African American 84 >=60 mL/min   Narrative   Performed at:  [address], [address]                [place], [place] [address]  CBC with Differential/Platelet  Result Value Ref Range   WBC 9.7 3.8 - 10.8 K/uL   RBC 5.04 4.20 - 5.80 MIL/uL   Hemoglobin 17.2 (H) 13.2 - 17.1 g/dL   HCT 49.7 38.5 - 50.0 %   MCV 98.6 80.0 - 100.0 fL   MCH 34.1 (H) 27.0 - 33.0 pg   MCHC 34.6 32.0 - 36.0 g/dL   RDW 13.4 11.0 - 15.0 %   Platelets 264 140 - 400 K/uL   MPV 9.7 7.5 - 12.5 fL   Neutro Abs 5,626 1,500 - 7,800 cells/uL   Lymphs Abs 2,522 850 - 3,900 cells/uL   Monocytes Absolute 970 (H) 200 - 950 cells/uL   Eosinophils Absolute 582 (H) 15 - 500 cells/uL   Basophils Absolute 0 0 - 200 cells/uL   Neutrophils Relative % 58 %   Lymphocytes Relative 26 %   Monocytes Relative 10 %   Eosinophils Relative 6 %   Basophils Relative 0 %   Smear Review Criteria for review not met    Narrative   Performed at:  [address], [address]                [place], [place] [address]  Lipid panel  Result Value Ref Range   Cholesterol 228 (H) 125 - 200 mg/dL   Triglycerides 211 (H) <150 mg/dL   HDL 43 >=40 mg/dL   Total CHOL/HDL Ratio 5.3 (H) <=5.0 Ratio   VLDL 42 (H) <30 mg/dL   LDL Cholesterol 143 (H) <130 mg/dL     Comment:       Total Cholesterol/HDL Ratio:CHD Risk                        Coronary Heart Disease Risk Table  Men       Women          1/2 Average Risk              3.4        3.3              Average Risk              5.0        4.4           2X Average Risk              9.6        7.1           3X Average Risk             23.4       11.0 Use the calculated Patient Ratio above and the CHD Risk table  to determine the patient's CHD Risk.    Narrative   Performed at:  [facility]                [address], [address]                [place], [place] [address]  TSH  Result Value Ref Range   TSH 0.70 0.40 - 4.50 mIU/L   Narrative   Performed at:  [address], [address]                [place], [place] [address]     If you have any questions or concerns, please don't hesitate to call.  Sincerely,   Carroll Sage. Kenton Kingfisher, MSN, FNP-C [facility]

## 2016-04-11 NOTE — Telephone Encounter (Signed)
Please call patient and advise that his kidney function is fine and he can start the losartan for his blood pressure.  Thanks,  Logan CourtsKimberly Lamario Mani

## 2016-04-13 ENCOUNTER — Telehealth: Payer: Self-pay | Admitting: Emergency Medicine

## 2016-04-13 NOTE — Telephone Encounter (Signed)
Left message with kidney function and medication instructions

## 2017-02-21 ENCOUNTER — Other Ambulatory Visit: Payer: Self-pay | Admitting: Family Medicine

## 2017-03-26 ENCOUNTER — Other Ambulatory Visit: Payer: Self-pay | Admitting: Family Medicine

## 2017-04-02 ENCOUNTER — Telehealth: Payer: Self-pay | Admitting: Family Medicine

## 2017-04-02 ENCOUNTER — Telehealth: Payer: Self-pay | Admitting: *Deleted

## 2017-04-02 NOTE — Telephone Encounter (Signed)
LMOM for pt to call the office to schedule an appt. Pt advised per message that we do have Saturday hours if this works better for him. Pt last seen on 04/09/16 with Jerrilyn Cairo, NP.

## 2017-04-02 NOTE — Telephone Encounter (Signed)
Pt is trying to work around his schedule to come in and see a new provider before getting his blood pressure meds which he is completely out and would like to see about a refill just enough to get him in for an appt

## 2017-04-16 ENCOUNTER — Encounter: Payer: Self-pay | Admitting: Urgent Care

## 2017-04-16 ENCOUNTER — Ambulatory Visit (INDEPENDENT_AMBULATORY_CARE_PROVIDER_SITE_OTHER): Payer: Self-pay | Admitting: Urgent Care

## 2017-04-16 VITALS — BP 156/86 | HR 82 | Temp 98.5°F | Resp 16 | Ht 68.0 in | Wt 194.2 lb

## 2017-04-16 DIAGNOSIS — I10 Essential (primary) hypertension: Secondary | ICD-10-CM

## 2017-04-16 DIAGNOSIS — F17200 Nicotine dependence, unspecified, uncomplicated: Secondary | ICD-10-CM

## 2017-04-16 DIAGNOSIS — R030 Elevated blood-pressure reading, without diagnosis of hypertension: Secondary | ICD-10-CM

## 2017-04-16 MED ORDER — LOSARTAN POTASSIUM 50 MG PO TABS: 50.0000 mg | ORAL_TABLET | Freq: Every day | ORAL | 1 refills | Status: DC

## 2017-04-16 NOTE — Progress Notes (Signed)
   MRN: [id number] DOB: May 01, 1951  Subjective:   Logan Green is a 66 y.o. male presenting for follow up on Hypertension.   Currently managed with losartan, has been out of medication for 2 months. Patient is not checking blood pressure at home. Avoids salt in diet, is not exercising but stays active. Denies dizziness, chronic headache, blurred vision, chest pain, shortness of breath, heart racing, palpitations, nausea, vomiting, abdominal pain, hematuria, lower leg swelling. Smokes ~1ppd, has cut back from 2ppd. Denies drinking alcohol.   [place] has a current medication list which includes the following prescription(s): losartan. Also has No Known Allergies.  Logan Green  has a past medical history of Hypertension. Denies past surgical history.   Objective:   Vitals: BP (!) 156/86   Pulse 82   Temp 98.5 F (36.9 C) (Oral)   Resp 16   Ht  (1.727 m)   Wt 194 lb 3.2 oz (88.1 kg)   SpO2 97%   BMI 29.53 kg/m   BP Readings from Last 3 Encounters:  04/16/17 (!) 156/86  04/09/16 140/70  12/20/12 132/78   Physical Exam  Constitutional: He is oriented to person, place, and time. He appears well-developed and well-nourished.  HENT:  Mouth/Throat: Oropharynx is clear and moist.  Cardiovascular: Normal rate, regular rhythm and intact distal pulses.  Exam reveals no gallop and no friction rub.   No murmur heard. Pulmonary/Chest: No respiratory distress. He has no wheezes. He has no rales.  Musculoskeletal: He exhibits no edema.  Neurological: He is alert and oriented to person, place, and time.  Skin: Skin is warm and dry.   Assessment and Plan :   1. Essential hypertension 2. Elevated blood pressure reading - Restart losartan, counseled patient on risks of uncontrolled HTN. Labs pending. Patient is not very compliant. He is not agreeable to regular office visits. I gave him parameters to follow for his blood pressure checks. Return-to-clinic precautions discussed, patient  verbalized understanding.  - Comprehensive metabolic panel   3. Tobacco use disorder - Counseled on smoking cessation including hazards of smoking.  Wallis Bamberg, PA-C Primary Care at [facility] [phone] 04/16/2017  2:19 PM

## 2017-04-16 NOTE — Patient Instructions (Addendum)
Hypertension Hypertension, commonly called high blood pressure, is when the force of blood pumping through the arteries is too strong. The arteries are the blood vessels that carry blood from the heart throughout the body. Hypertension forces the heart to work harder to pump blood and may cause arteries to become narrow or stiff. Having untreated or uncontrolled hypertension can cause heart attacks, strokes, kidney disease, and other problems. A blood pressure reading consists of a higher number over a lower number. Ideally, your blood pressure should be below 120/80. The first ("top") number is called the systolic pressure. It is a measure of the pressure in your arteries as your heart beats. The second ("bottom") number is called the diastolic pressure. It is a measure of the pressure in your arteries as the heart relaxes. What are the causes? The cause of this condition is not known. What increases the risk? Some risk factors for high blood pressure are under your control. Others are not. Factors you can change  Smoking.  Having type 2 diabetes mellitus, high cholesterol, or both.  Not getting enough exercise or physical activity.  Being overweight.  Having too much fat, sugar, calories, or salt (sodium) in your diet.  Drinking too much alcohol. Factors that are difficult or impossible to change  Having chronic kidney disease.  Having a family history of high blood pressure.  Age. Risk increases with age.  Race. You may be at higher risk if you are African-American.  Gender. Men are at higher risk than women before age 45. After age 65, women are at higher risk than men.  Having obstructive sleep apnea.  Stress. What are the signs or symptoms? Extremely high blood pressure (hypertensive crisis) may cause:  Headache.  Anxiety.  Shortness of breath.  Nosebleed.  Nausea and vomiting.  Severe chest pain.  Jerky movements you cannot control (seizures).  How is this  diagnosed? This condition is diagnosed by measuring your blood pressure while you are seated, with your arm resting on a surface. The cuff of the blood pressure monitor will be placed directly against the skin of your upper arm at the level of your heart. It should be measured at least twice using the same arm. Certain conditions can cause a difference in blood pressure between your right and left arms. Certain factors can cause blood pressure readings to be lower or higher than normal (elevated) for a short period of time:  When your blood pressure is higher when you are in a health care provider's office than when you are at home, this is called white coat hypertension. Most people with this condition do not need medicines.  When your blood pressure is higher at home than when you are in a health care provider's office, this is called masked hypertension. Most people with this condition may need medicines to control blood pressure.  If you have a high blood pressure reading during one visit or you have normal blood pressure with other risk factors:  You may be asked to return on a different day to have your blood pressure checked again.  You may be asked to monitor your blood pressure at home for 1 week or longer.  If you are diagnosed with hypertension, you may have other blood or imaging tests to help your health care provider understand your overall risk for other conditions. How is this treated? This condition is treated by making healthy lifestyle changes, such as eating healthy foods, exercising more, and reducing your alcohol intake. Your   health care provider may prescribe medicine if lifestyle changes are not enough to get your blood pressure under control, and if:  Your systolic blood pressure is above 130.  Your diastolic blood pressure is above 80.  Your personal target blood pressure may vary depending on your medical conditions, your age, and other factors. Follow these  instructions at home: Eating and drinking  Eat a diet that is high in fiber and potassium, and low in sodium, added sugar, and fat. An example eating plan is called the DASH (Dietary Approaches to Stop Hypertension) diet. To eat this way: ? Eat plenty of fresh fruits and vegetables. Try to fill half of your plate at each meal with fruits and vegetables. ? Eat whole grains, such as whole wheat pasta, brown rice, or whole grain bread. Fill about one quarter of your plate with whole grains. ? Eat or drink low-fat dairy products, such as skim milk or low-fat yogurt. ? Avoid fatty cuts of meat, processed or cured meats, and poultry with skin. Fill about one quarter of your plate with lean proteins, such as fish, chicken without skin, beans, eggs, and tofu. ? Avoid premade and processed foods. These tend to be higher in sodium, added sugar, and fat.  Reduce your daily sodium intake. Most people with hypertension should eat less than 1,500 mg of sodium a day.  Limit alcohol intake to no more than 1 drink a day for nonpregnant women and 2 drinks a day for men. One drink equals 12 oz of beer, 5 oz of wine, or 1 oz of hard liquor. Lifestyle  Work with your health care provider to maintain a healthy body weight or to lose weight. Ask what an ideal weight is for you.  Get at least 30 minutes of exercise that causes your heart to beat faster (aerobic exercise) most days of the week. Activities may include walking, swimming, or biking.  Include exercise to strengthen your muscles (resistance exercise), such as pilates or lifting weights, as part of your weekly exercise routine. Try to do these types of exercises for 30 minutes at least 3 days a week.  Do not use any products that contain nicotine or tobacco, such as cigarettes and e-cigarettes. If you need help quitting, ask your health care provider.  Monitor your blood pressure at home as told by your health care provider.  Keep all follow-up visits as  told by your health care provider. This is important. Medicines  Take over-the-counter and prescription medicines only as told by your health care provider. Follow directions carefully. Blood pressure medicines must be taken as prescribed.  Do not skip doses of blood pressure medicine. Doing this puts you at risk for problems and can make the medicine less effective.  Ask your health care provider about side effects or reactions to medicines that you should watch for. Contact a health care provider if:  You think you are having a reaction to a medicine you are taking.  You have headaches that keep coming back (recurring).  You feel dizzy.  You have swelling in your ankles.  You have trouble with your vision. Get help right away if:  You develop a severe headache or confusion.  You have unusual weakness or numbness.  You feel faint.  You have severe pain in your chest or abdomen.  You vomit repeatedly.  You have trouble breathing. Summary  Hypertension is when the force of blood pumping through your arteries is too strong. If this condition is not   controlled, it may put you at risk for serious complications.  Your personal target blood pressure may vary depending on your medical conditions, your age, and other factors. For most people, a normal blood pressure is less than 120/80.  Hypertension is treated with lifestyle changes, medicines, or a combination of both. Lifestyle changes include weight loss, eating a healthy, low-sodium diet, exercising more, and limiting alcohol. This information is not intended to replace advice given to you by your health care provider. Make sure you discuss any questions you have with your health care provider. Document Released: 07/02/2005 Document Revised: 05/30/2016 Document Reviewed: 05/30/2016 Elsevier Interactive Patient Education  2018 [employer].     Steps to Quit Smoking Smoking tobacco can be harmful to your health and can  affect almost every organ in your body. Smoking puts you, and those around you, at risk for developing many serious chronic diseases. Quitting smoking is difficult, but it is one of the best things that you can do for your health. It is never too late to quit. What are the benefits of quitting smoking? When you quit smoking, you lower your risk of developing serious diseases and conditions, such as:  Lung cancer or lung disease, such as COPD.  Heart disease.  Stroke.  Heart attack.  Infertility.  Osteoporosis and bone fractures.  Additionally, symptoms such as coughing, wheezing, and shortness of breath may get better when you quit. You may also find that you get sick less often because your body is stronger at fighting off colds and infections. If you are pregnant, quitting smoking can help to reduce your chances of having a baby of low birth weight. How do I get ready to quit? When you decide to quit smoking, create a plan to make sure that you are successful. Before you quit:  Pick a date to quit. Set a date within the next two weeks to give you time to prepare.  Write down the reasons why you are quitting. Keep this list in places where you will see it often, such as on your bathroom mirror or in your car or wallet.  Identify the people, places, things, and activities that make you want to smoke (triggers) and avoid them. Make sure to take these actions: ? Throw away all cigarettes at home, at work, and in your car. ? Throw away smoking accessories, such as Set designer. ? Clean your car and make sure to empty the ashtray. ? Clean your home, including curtains and carpets.  Tell your family, friends, and coworkers that you are quitting. Support from your loved ones can make quitting easier.  Talk with your health care provider about your options for quitting smoking.  Find out what treatment options are covered by your health insurance.  What strategies can I use to  quit smoking? Talk with your healthcare provider about different strategies to quit smoking. Some strategies include:  Quitting smoking altogether instead of gradually lessening how much you smoke over a period of time. Research shows that quitting "cold [place]" is more successful than gradually quitting.  Attending in-person counseling to help you build problem-solving skills. You are more likely to have success in quitting if you attend several counseling sessions. Even short sessions of 10 minutes can be effective.  Finding resources and support systems that can help you to quit smoking and remain smoke-free after you quit. These resources are most helpful when you use them often. They can include: ? Online chats with a Veterinary surgeon. ?  Telephone quitlines. ? Automotive engineer. ? Support groups or group counseling. ? Text messaging programs. ? Mobile phone applications.  Taking medicines to help you quit smoking. (If you are pregnant or breastfeeding, talk with your health care provider first.) Some medicines contain nicotine and some do not. Both types of medicines help with cravings, but the medicines that include nicotine help to relieve withdrawal symptoms. Your health care provider may recommend: ? Nicotine patches, gum, or lozenges. ? Nicotine inhalers or sprays. ? Non-nicotine medicine that is taken by mouth.  Talk with your health care provider about combining strategies, such as taking medicines while you are also receiving in-person counseling. Using these two strategies together makes you more likely to succeed in quitting than if you used either strategy on its own. If you are pregnant or breastfeeding, talk with your health care provider about finding counseling or other support strategies to quit smoking. Do not take medicine to help you quit smoking unless told to do so by your health care provider. What things can I do to make it easier to quit? Quitting smoking might  feel overwhelming at first, but there is a lot that you can do to make it easier. Take these important actions:  Reach out to your family and friends and ask that they support and encourage you during this time. Call telephone quitlines, reach out to support groups, or work with a counselor for support.  Ask people who smoke to avoid smoking around you.  Avoid places that trigger you to smoke, such as bars, parties, or smoke-break areas at work.  Spend time around people who do not smoke.  Lessen stress in your life, because stress can be a smoking trigger for some people. To lessen stress, try: ? Exercising regularly. ? Deep-breathing exercises. ? Yoga. ? Meditating. ? Performing a body scan. This involves closing your eyes, scanning your body from head to toe, and noticing which parts of your body are particularly tense. Purposefully relax the muscles in those areas.  Download or purchase mobile phone or tablet apps (applications) that can help you stick to your quit plan by providing reminders, tips, and encouragement. There are many free apps, such as QuitGuide from the [employer] developer for Disease Control and Prevention). You can find other support for quitting smoking (smoking cessation) through smokefree.gov and other websites.  How will I feel when I quit smoking? Within the first 24 hours of quitting smoking, you may start to feel some withdrawal symptoms. These symptoms are usually most noticeable 2-3 days after quitting, but they usually do not last beyond 2-3 weeks. Changes or symptoms that you might experience include:  Mood swings.  Restlessness, anxiety, or irritation.  Difficulty concentrating.  Dizziness.  Strong cravings for sugary foods in addition to nicotine.  Mild weight gain.  Constipation.  Nausea.  Coughing or a sore throat.  Changes in how your medicines work in your body.  A depressed mood.  Difficulty sleeping (insomnia).  After the first 2-3  weeks of quitting, you may start to notice more positive results, such as:  Improved sense of smell and taste.  Decreased coughing and sore throat.  Slower heart rate.  Lower blood pressure.  Clearer skin.  The ability to breathe more easily.  Fewer sick days.  Quitting smoking is very challenging for most people. Do not get discouraged if you are not successful the first time. Some people need to make many attempts to quit before they achieve long-term success. Do  your best to stick to your quit plan, and talk with your health care provider if you have any questions or concerns. This information is not intended to replace advice given to you by your health care provider. Make sure you discuss any questions you have with your health care provider. Document Released: 06/26/2001 Document Revised: 02/28/2016 Document Reviewed: 11/16/2014 Elsevier Interactive Patient Education  2017 [employer].     IF you received an x-ray today, you will receive an invoice from [facility]. Please contact [facility] at [phone] with questions or concerns regarding your invoice.   IF you received labwork today, you will receive an invoice from [place]. Please contact [facility] at [phone] with questions or concerns regarding your invoice.   Our billing staff will not be able to assist you with questions regarding bills from these companies.  You will be contacted with the lab results as soon as they are available. The fastest way to get your results is to activate your My Chart account. Instructions are located on the last page of this paperwork. If you have not heard from [place] regarding the results in 2 weeks, please contact this office.

## 2017-04-17 LAB — COMPREHENSIVE METABOLIC PANEL
A/G RATIO: 1.5 (ref 1.2–2.2)
ALT: 14 IU/L (ref 0–44)
AST: 18 IU/L (ref 0–40)
Albumin: 4.3 g/dL (ref 3.6–4.8)
Alkaline Phosphatase: 59 IU/L (ref 39–117)
BUN/Creatinine Ratio: 15 (ref 10–24)
BUN: 16 mg/dL (ref 8–27)
Bilirubin Total: 0.4 mg/dL (ref 0.0–1.2)
CALCIUM: 9.8 mg/dL (ref 8.6–10.2)
CO2: 24 mmol/L (ref 20–29)
CREATININE: 1.1 mg/dL (ref 0.76–1.27)
Chloride: 98 mmol/L (ref 96–106)
GFR, EST AFRICAN AMERICAN: 82 mL/min/{1.73_m2} (ref >59)
GFR, EST NON AFRICAN AMERICAN: 71 mL/min/{1.73_m2} (ref >59)
GLUCOSE: 99 mg/dL (ref 65–99)
Globulin, Total: 2.9 g/dL (ref 1.5–4.5)
Potassium: 4.3 mmol/L (ref 3.5–5.2)
Sodium: 136 mmol/L (ref 134–144)
TOTAL PROTEIN: 7.2 g/dL (ref 6.0–8.5)

## 2017-04-25 ENCOUNTER — Encounter: Payer: Self-pay | Admitting: Urgent Care

## 2017-07-22 NOTE — Telephone Encounter (Signed)
error 

## 2017-10-17 ENCOUNTER — Ambulatory Visit: Payer: Self-pay | Admitting: Urgent Care

## 2017-10-31 ENCOUNTER — Encounter: Payer: Self-pay | Admitting: Urgent Care

## 2017-10-31 ENCOUNTER — Ambulatory Visit: Payer: Self-pay | Admitting: Urgent Care

## 2017-10-31 ENCOUNTER — Ambulatory Visit (INDEPENDENT_AMBULATORY_CARE_PROVIDER_SITE_OTHER): Payer: Self-pay

## 2017-10-31 VITALS — BP 170/60 | HR 88 | Temp 98.8°F | Resp 18 | Ht 68.0 in | Wt 194.2 lb

## 2017-10-31 DIAGNOSIS — M25461 Effusion, right knee: Secondary | ICD-10-CM

## 2017-10-31 DIAGNOSIS — M25561 Pain in right knee: Secondary | ICD-10-CM

## 2017-10-31 DIAGNOSIS — B351 Tinea unguium: Secondary | ICD-10-CM

## 2017-10-31 DIAGNOSIS — R030 Elevated blood-pressure reading, without diagnosis of hypertension: Secondary | ICD-10-CM

## 2017-10-31 DIAGNOSIS — M1711 Unilateral primary osteoarthritis, right knee: Secondary | ICD-10-CM

## 2017-10-31 DIAGNOSIS — R55 Syncope and collapse: Secondary | ICD-10-CM

## 2017-10-31 DIAGNOSIS — I10 Essential (primary) hypertension: Secondary | ICD-10-CM

## 2017-10-31 DIAGNOSIS — R42 Dizziness and giddiness: Secondary | ICD-10-CM

## 2017-10-31 MED ORDER — ATORVASTATIN CALCIUM 40 MG PO TABS: 40.0000 mg | ORAL_TABLET | Freq: Every day | ORAL | 3 refills | Status: DC

## 2017-10-31 MED ORDER — KETOCONAZOLE 2 % EX CREA: 1.0000 "application " | TOPICAL_CREAM | Freq: Every day | CUTANEOUS | 1 refills | Status: DC

## 2017-10-31 MED ORDER — PREDNISONE 10 MG PO TABS: 10.0000 mg | ORAL_TABLET | Freq: Every day | ORAL | 0 refills | Status: DC

## 2017-10-31 MED ORDER — MELOXICAM 7.5 MG PO TABS: 7.5000 mg | ORAL_TABLET | Freq: Every day | ORAL | 5 refills | Status: DC

## 2017-10-31 MED ORDER — LISINOPRIL-HYDROCHLOROTHIAZIDE 20-25 MG PO TABS: 1.0000 | ORAL_TABLET | Freq: Every day | ORAL | 0 refills | Status: DC

## 2017-10-31 MED ORDER — PREDNISONE 20 MG PO TABS: 20.0000 mg | ORAL_TABLET | Freq: Every day | ORAL | 0 refills | Status: DC

## 2017-10-31 MED ORDER — ASPIRIN 81 MG PO TABS: 81.0000 mg | ORAL_TABLET | Freq: Every day | ORAL | 1 refills | Status: AC

## 2017-10-31 NOTE — Patient Instructions (Addendum)
Start taking prednisone for this next week. Do not use meloxicam for an entire week after you are finished with prednisone. In the future, you can use meloxicam for your knee arthritis. Hydrate well with at least 2 liters (1 gallon) of water daily.  If you develop chest pain, dizziness, severe headaches, then report to the emergency room immediately as you could be having a heart attack or a stroke.    Hypertension Hypertension is another name for high blood pressure. High blood pressure forces your heart to work harder to pump blood. This can cause problems over time. There are two numbers in a blood pressure reading. There is a top number (systolic) over a bottom number (diastolic). It is best to have a blood pressure below 120/80. Healthy choices can help lower your blood pressure. You may need medicine to help lower your blood pressure if:  Your blood pressure cannot be lowered with healthy choices.  Your blood pressure is higher than 130/80.  Follow these instructions at home: Eating and drinking  If directed, follow the DASH eating plan. This diet includes: ? Filling half of your plate at each meal with fruits and vegetables. ? Filling one quarter of your plate at each meal with whole grains. Whole grains include whole wheat pasta, brown rice, and whole grain bread. ? Eating or drinking low-fat dairy products, such as skim milk or low-fat yogurt. ? Filling one quarter of your plate at each meal with low-fat (lean) proteins. Low-fat proteins include fish, skinless chicken, eggs, beans, and tofu. ? Avoiding fatty meat, cured and processed meat, or chicken with skin. ? Avoiding premade or processed food.  Eat less than 1,500 mg of salt (sodium) a day.  Limit alcohol use to no more than 1 drink a day for nonpregnant women and 2 drinks a day for men. One drink equals 12 oz of beer, 5 oz of wine, or 1 oz of hard liquor. Lifestyle  Work with your doctor to stay at a healthy weight or to  lose weight. Ask your doctor what the best weight is for you.  Get at least 30 minutes of exercise that causes your heart to beat faster (aerobic exercise) most days of the week. This may include walking, swimming, or biking.  Get at least 30 minutes of exercise that strengthens your muscles (resistance exercise) at least 3 days a week. This may include lifting weights or pilates.  Do not use any products that contain nicotine or tobacco. This includes cigarettes and e-cigarettes. If you need help quitting, ask your doctor.  Check your blood pressure at home as told by your doctor.  Keep all follow-up visits as told by your doctor. This is important. Medicines  Take over-the-counter and prescription medicines only as told by your doctor. Follow directions carefully.  Do not skip doses of blood pressure medicine. The medicine does not work as well if you skip doses. Skipping doses also puts you at risk for problems.  Ask your doctor about side effects or reactions to medicines that you should watch for. Contact a doctor if:  You think you are having a reaction to the medicine you are taking.  You have headaches that keep coming back (recurring).  You feel dizzy.  You have swelling in your ankles.  You have trouble with your vision. Get help right away if:  You get a very bad headache.  You start to feel confused.  You feel weak or numb.  You feel faint.  You  get very bad pain in your: ? Chest. ? Belly (abdomen).  You throw up (vomit) more than once.  You have trouble breathing. Summary  Hypertension is another name for high blood pressure.  Making healthy choices can help lower blood pressure. If your blood pressure cannot be controlled with healthy choices, you may need to take medicine. This information is not intended to replace advice given to you by your health care provider. Make sure you discuss any questions you have with your health care provider. Document  Released: 12/19/2007 Document Revised: 05/30/2016 Document Reviewed: 05/30/2016 Elsevier Interactive Patient Education  2018 [employer].     IF you received an x-ray today, you will receive an invoice from [facility]. Please contact [facility] at [phone] with questions or concerns regarding your invoice.   IF you received labwork today, you will receive an invoice from [place]. Please contact [facility] at [phone] with questions or concerns regarding your invoice.   Our billing staff will not be able to assist you with questions regarding bills from these companies.  You will be contacted with the lab results as soon as they are available. The fastest way to get your results is to activate your My Chart account. Instructions are located on the last page of this paperwork. If you have not heard from [place] regarding the results in 2 weeks, please contact this office.

## 2017-10-31 NOTE — Progress Notes (Signed)
MRN: [id number] DOB: 11-07-1950  Subjective:   Logan Green is a 67 y.o. male presenting for follow up on Hypertension. Currently managed with losartan 50mg . Patient is not checking blood pressure at home. Patient is retired, does not stay active.  Admits that he gets intermittent dizziness, shakiness and blurred vision especially when he is not as active physically.  He states that he notices his blood pressure.  Denies chronic headache, chest pain, shortness of breath, heart racing, palpitations, nausea, vomiting, abdominal pain, hematuria. Also reports 3 week history of right knee swelling.  Has a long-standing history of intermittent knee pain bilaterally and swelling.  Patient has been told that this is associated with his arthritis.  Has had effusions that have been drained in the past.  Has not needed this for years.  Patient also reports that he has had several week history of worsening toenail discomfort.  States that it is growing very differently and is larger than the rest.  He has used over-the-counter antifungal remedies on his great toenail without any significant change.  Patient states that he does not walk around and a lot of moist environments or keep his feet wet.  Of note, patient reported at the end of his visit that he had an episode of syncope and collapse 2 weeks ago.  Patient states that he felt dizzy just prior to that and a little weak.  He denies having headache, confusion since then.  He has not had recurrence of syncope or dizziness.  He did not seek medical attention at that time.  Logan Green has a current medication list which includes the following prescription(s): losartan. Also has No Known Allergies.  Logan Green  has a past medical history of Hypertension. Denies past surgical history.   Objective:   Vitals: BP (!) 174/64   Pulse 88   Temp 98.8 F (37.1 C) (Oral)   Resp 18   Ht 5\' 8"  (1.727 m)   Wt 194 lb 3.2 oz (88.1 kg)   SpO2 99%   BMI 29.53 kg/m   BP  Readings from Last 3 Encounters:  10/31/17 (!) 170/60  04/16/17 (!) 156/86  04/09/16 140/70    Physical Exam  Constitutional: He is oriented to person, place, and time. He appears well-developed and well-nourished.  HENT:  Mouth/Throat: Oropharynx is clear and moist.  Eyes: Pupils are equal, round, and reactive to light. EOM are normal. No scleral icterus.  Neck: Normal range of motion. Neck supple. No JVD present.  Cardiovascular: Normal rate, regular rhythm and intact distal pulses. Exam reveals no gallop and no friction rub.  No murmur heard. Pulmonary/Chest: No respiratory distress. He has no wheezes. He has no rales.  Abdominal: Soft. Bowel sounds are normal. He exhibits no distension and no mass. There is no tenderness. There is no rebound and no guarding.  Musculoskeletal: He exhibits edema (up to calves bilaterally).       Right knee: He exhibits swelling (trace). He exhibits normal range of motion, no ecchymosis, no deformity, no erythema, normal alignment, normal patellar mobility and no bony tenderness. No tenderness found.  Neurological: He is alert and oriented to person, place, and time. He displays normal reflexes. No cranial nerve deficit.  Skin: Skin is warm and dry. Capillary refill takes less than 2 seconds.  Hyperkeratotic left great toe nail.  Psychiatric: He has a normal mood and affect.   Dg Knee Complete 4 Views Right  Result Date: 10/31/2017 CLINICAL DATA:  Recurrent RIGHT knee pain and  swelling. EXAM: RIGHT KNEE - COMPLETE 4+ VIEW COMPARISON:  None. FINDINGS: No fracture deformity or dislocation. Severe medial compartment narrowing with periarticular sclerosis and marginal spurring. Mild lateral and moderate patellofemoral compartment narrowing with marginal spurring. No destructive bony lesions. Moderate suprapatellar joint effusion. No subcutaneous gas or radiopaque foreign bodies. No pathologic calcifications. IMPRESSION: No acute fracture deformity or  dislocation. Tricompartmental osteoarthrosis, severe within medial compartment. Suprapatellar joint effusion. Electronically Signed   By: Awilda Metro M.D.   On: 10/31/2017 15:36   Assessment and Plan :   Essential hypertension - Plan: Comprehensive metabolic panel, Lipid panel  Elevated blood pressure reading  Acute pain of right knee - Plan: DG Knee Complete 4 Views Right  Swelling of joint of right knee - Plan: DG Knee Complete 4 Views Right  Osteoarthritis of right knee, unspecified osteoarthritis type  Onychomycosis  Syncope and collapse  Dizziness  's with patient extensively on need for better blood pressure control.  Patient's physical exam findings are very reassuring today including his neurologic and cardiovascular exams.  Discussed with patient need for aggressive management of his blood pressure and will start lisinopril hydrochlorothiazide.  Stop losartan.  We will also have patient start 81 mg aspirin, 40 mg atorvastatin.  Encourage patient to continue efforts at smoking cessation.  He will also start ketoconazole for onychomycosis of his left great toenail.  Patient declined aspiration of his effusion for his right knee today.  He opted to do a short steroid course at 30 mg once daily for 1 week.  I counseled patient on use of meloxicam in the future for his severe osteoarthritis of his knee.  I reviewed ER and return to clinic precautions pertaining to his uncontrolled high blood pressure.  Potential for side effects reviewed with patient.  Follow-up in 4 weeks.  Wallis Bamberg, PA-C Primary Care at [facility] [phone] 10/31/2017  2:35 PM

## 2017-11-01 LAB — COMPREHENSIVE METABOLIC PANEL
ALK PHOS: 56 IU/L (ref 39–117)
ALT: 14 IU/L (ref 0–44)
AST: 19 IU/L (ref 0–40)
Albumin/Globulin Ratio: 1.5 (ref 1.2–2.2)
Albumin: 4.3 g/dL (ref 3.6–4.8)
BUN/Creatinine Ratio: 11 (ref 10–24)
BUN: 12 mg/dL (ref 8–27)
Bilirubin Total: 0.5 mg/dL (ref 0.0–1.2)
CHLORIDE: 102 mmol/L (ref 96–106)
CO2: 23 mmol/L (ref 20–29)
CREATININE: 1.06 mg/dL (ref 0.76–1.27)
Calcium: 9.3 mg/dL (ref 8.6–10.2)
GFR calc Af Amer: 85 mL/min/{1.73_m2} (ref >59)
GFR calc non Af Amer: 74 mL/min/{1.73_m2} (ref >59)
GLOBULIN, TOTAL: 2.8 g/dL (ref 1.5–4.5)
GLUCOSE: 111 mg/dL — AB (ref 65–99)
POTASSIUM: 4.2 mmol/L (ref 3.5–5.2)
SODIUM: 139 mmol/L (ref 134–144)
Total Protein: 7.1 g/dL (ref 6.0–8.5)

## 2017-11-01 LAB — LIPID PANEL
CHOL/HDL RATIO: 5.5 ratio — AB (ref 0.0–5.0)
CHOLESTEROL TOTAL: 197 mg/dL (ref 100–199)
HDL: 36 mg/dL — ABNORMAL LOW (ref >39)
LDL CALC: 137 mg/dL — AB (ref 0–99)
TRIGLYCERIDES: 121 mg/dL (ref 0–149)
VLDL Cholesterol Cal: 24 mg/dL (ref 5–40)

## 2017-11-04 ENCOUNTER — Telehealth: Payer: Self-pay

## 2017-11-04 NOTE — Telephone Encounter (Signed)
A&P from 10/31/2017 reads that the patient is supposed to take 30mg  daily. The sig is clear on the dosing. There is no 30mg  tablet of prednisone. I discussed this with patient in clinic. Please have patient take the prednisone as prescribed.

## 2017-11-04 NOTE — Telephone Encounter (Signed)
Please advise on directions for prednisone. Sig is not clear on how many per day and frequency.  Copied from CRM [phone]. Topic: General - Other >> Nov 04, 2017  1:47 PM Gerrianne ScalePayne, Angela L wrote: Reason for CRM:     [id number] - [place] - [phone] [facility] [address] [facility] [address] [phone] (Phone) calling wanting to know the correct directions on the predniSONE (DELTASONE) 20 MG tablet

## 2017-11-05 ENCOUNTER — Ambulatory Visit: Payer: Self-pay

## 2017-11-05 NOTE — Telephone Encounter (Signed)
Please see below.

## 2017-11-05 NOTE — Telephone Encounter (Signed)
Pharmacy was called and the medication dosing was confirmed.  Pharmacy verbalized understanding that the patient is to only take 1 (10 mg) tablet and 1 (20mg ) tablet daily with breakfast.  Which will conclude a total of 2 tabs (30mg ) daily with breakfast.

## 2017-11-05 NOTE — Telephone Encounter (Signed)
Pt calling with c/o chest pain accompanied with sweating. Pt stated chest pain began last night and had 2-3 episodes that lasted approximately 5 minutes. The pain comes and goes. Pt rates pain is a 6/10 when these events occur. Denies any radiation to the jaw,arm, or back. Pt thinks bologna sandwiches and pizza are causing his symptoms. Pt takes Tums and he thinks it helps.  Pt states that he had a heart murmur and rheumatic fever as a child. He stated that he has smoked since he was 67 years old.  Pt advised to go to the ED ASAP and have a driver take him. [facility] Care advice given per protocol.  Reason for Disposition . Chest pain lasts > 5 minutes (Exceptions: chest pain occurring > 3 days ago and now asymptomatic; same as previously diagnosed heartburn and has accompanying sour taste in mouth)  Answer Assessment - Initial Assessment Questions 1. LOCATION: "Where does it hurt?"       Center part of upper chest 2. RADIATION: "Does the pain go anywhere else?" (e.g., into neck, jaw, arms, back)     no 3. ONSET: "When did the chest pain begin?" (Minutes, hours or days)      Last night then today with 2-3 episodes accompanied with sweating 4. PATTERN "Does the pain come and go, or has it been constant since it started?"  "Does it get worse with exertion?"      yes 5. DURATION: "How long does it last" (e.g., seconds, minutes, hours)     5 minutes then eases off 6. SEVERITY: "How bad is the pain?"  (e.g., Scale 1-10; mild, moderate, or severe)    - MILD (1-3): doesn't interfere with normal activities     - MODERATE (4-7): interferes with normal activities or awakens from sleep    - SEVERE (8-10): excruciating pain, unable to do any normal activities       5-6 out of 10 7. CARDIAC RISK FACTORS: "Do you have any history of heart problems or risk factors for heart disease?" (e.g., prior heart attack, angina; high blood pressure, diabetes, being overweight, high cholesterol,  smoking, or strong family history of heart disease)     Had heart murmur and rheumatic fever, smoker 8. PULMONARY RISK FACTORS: "Do you have any history of lung disease?"  (e.g., blood clots in lung, asthma, emphysema, birth control pills)     smoker 9. CAUSE: "What do you think is causing the chest pain?"     Bologna sandwiches and pizza 10. OTHER SYMPTOMS: "Do you have any other symptoms?" (e.g., dizziness, nausea, vomiting, sweating, fever, difficulty breathing, cough)       Sweating, 11. PREGNANCY: "Is there any chance you are pregnant?" "When was your last menstrual period?"     n/a  Protocols used: CHEST PAIN-A-AH

## 2017-11-07 NOTE — Telephone Encounter (Signed)
Great advice. Patient is high risk and counseled him at his previous visit that he would need to go to the ER if he develops signs of ACS.

## 2017-11-30 ENCOUNTER — Encounter: Payer: Self-pay | Admitting: Urgent Care

## 2017-11-30 ENCOUNTER — Ambulatory Visit: Payer: Self-pay | Admitting: Urgent Care

## 2017-11-30 ENCOUNTER — Other Ambulatory Visit: Payer: Self-pay

## 2017-11-30 VITALS — BP 162/78 | HR 87 | Temp 98.6°F | Resp 18 | Ht 68.0 in | Wt 186.8 lb

## 2017-11-30 DIAGNOSIS — I10 Essential (primary) hypertension: Secondary | ICD-10-CM

## 2017-11-30 DIAGNOSIS — M1711 Unilateral primary osteoarthritis, right knee: Secondary | ICD-10-CM

## 2017-11-30 DIAGNOSIS — G4700 Insomnia, unspecified: Secondary | ICD-10-CM

## 2017-11-30 DIAGNOSIS — I491 Atrial premature depolarization: Secondary | ICD-10-CM

## 2017-11-30 DIAGNOSIS — E782 Mixed hyperlipidemia: Secondary | ICD-10-CM

## 2017-11-30 DIAGNOSIS — R030 Elevated blood-pressure reading, without diagnosis of hypertension: Secondary | ICD-10-CM

## 2017-11-30 DIAGNOSIS — R0789 Other chest pain: Secondary | ICD-10-CM

## 2017-11-30 MED ORDER — HYDROXYZINE PAMOATE 25 MG PO CAPS: 25.0000 mg | ORAL_CAPSULE | Freq: Every day | ORAL | 5 refills | Status: DC | PRN

## 2017-11-30 MED ORDER — OMEPRAZOLE 20 MG PO CPDR: 20.0000 mg | DELAYED_RELEASE_CAPSULE | Freq: Every day | ORAL | 3 refills | Status: DC

## 2017-11-30 MED ORDER — LISINOPRIL-HYDROCHLOROTHIAZIDE 20-25 MG PO TABS: 1.0000 | ORAL_TABLET | Freq: Every day | ORAL | 1 refills | Status: DC

## 2017-11-30 MED ORDER — RANITIDINE HCL 150 MG PO TABS: 150.0000 mg | ORAL_TABLET | Freq: Two times a day (BID) | ORAL | 0 refills | Status: DC

## 2017-11-30 MED ORDER — ATORVASTATIN CALCIUM 40 MG PO TABS: 40.0000 mg | ORAL_TABLET | Freq: Every day | ORAL | 3 refills | Status: DC

## 2017-11-30 NOTE — Progress Notes (Signed)
MRN: [id number] DOB: 13-Mar-1951  Subjective:   Logan Green is a 67 y.o. male presenting for follow up on Hypertension. Reports that he has had episodes of chest pain since his last OV, feels that it could have been heart burn or from starting his medications. As a result, he stopped taking his medications in the past 3 days. Last episode was mid-sternal, lasted ~3-4 minutes, resolved with rest or bearing down, belching. He had eye watering associated with the chest pain. He is also hydrating very well now. Denies fever, diaphoresis, dizziness, confusion, heart racing, palpitations, radiation of his chest pain, n/v, abdominal pain. Patient is still smoking but is trying to cut back and eventually quit.   Ruford has a current medication list which includes the following prescription(s): aspirin, atorvastatin, ketoconazole, lisinopril-hydrochlorothiazide, and meloxicam. Also has No Known Allergies.  Bertrand  has a past medical history of Hypertension. Denies past surgical history.   Objective:   Vitals: BP (!) 162/78 (BP Location: Left Arm, Patient Position: Sitting, Cuff Size: Normal)   Pulse 87   Temp 98.6 F (37 C) (Oral)   Resp 18   Ht  (1.727 m)   Wt 186 lb 12.8 oz (84.7 kg)   SpO2 97%   BMI 28.40 kg/m   BP Readings from Last 3 Encounters:  11/30/17 (!) 162/78  10/31/17 (!) 170/60  04/16/17 (!) 156/86    The 10-year ASCVD risk score Denman George DC Jr., et al., 2013) is: 32.8%   Values used to calculate the score:     Age: 29 years     Sex: Male     Is Non-Hispanic African American: No     Diabetic: No     Tobacco smoker: Yes     Systolic Blood Pressure: 162 mmHg     Is BP treated: Yes     HDL Cholesterol: 36 mg/dL     Total Cholesterol: 197 mg/dL   Wt Readings from Last 3 Encounters:  11/30/17 186 lb 12.8 oz (84.7 kg)  10/31/17 194 lb 3.2 oz (88.1 kg)  04/16/17 194 lb 3.2 oz (88.1 kg)    Physical Exam  Constitutional: He is oriented to person, place, and time. He  appears well-developed and well-nourished.  HENT:  Mouth/Throat: Oropharynx is clear and moist.  Hoarse voice.  Eyes: No scleral icterus.  Cardiovascular: Normal rate, regular rhythm and intact distal pulses. Exam reveals no gallop and no friction rub.  No murmur heard. Pulmonary/Chest: No stridor. No respiratory distress. He has no wheezes. He has no rales.  Neurological: He is alert and oriented to person, place, and time.  Skin: Skin is warm and dry.   ECG interpretation - 2 pac's noted but otherwise in sinus rhythm at 69bpm. No previous ecg for comparison.  Assessment and Plan :   Essential hypertension  Elevated blood pressure reading - Plan: Ambulatory referral to Cardiology  Atypical chest pain - Plan: EKG 12-Lead, Ambulatory referral to Cardiology  PAC (premature atrial contraction)  Mixed hyperlipidemia  Osteoarthritis of right knee, unspecified osteoarthritis type  Insomnia, unspecified type  Counseled patient and emphasized medical compliance.  Encouraged him to continue his efforts at quitting smoking.  He is going to restart his lisinopril and hydrochlorothiazide.  I also refilled his atorvastatin.  Patient reported that he is having trouble sleeping at the end of his visit and so we will use Vistaril for this as he has been trying Vicks nighttime medication.  To address heartburn as a source of  his chest discomfort we will start Zantac and Prilosec.  I will refer him to [facility] heart care for work-up, screening of heart disease.  ER and return to clinic precautions discussed.  Follow-up in 4 weeks.  Wallis Bamberg, PA-C Primary Care at [facility] [phone] 11/30/2017  3:50 PM

## 2017-11-30 NOTE — Patient Instructions (Addendum)
You need to take aspirin, atorvastatin (for cholesterol), lisinopril-hydrochlorothiazide (high blood pressure) every day. Use Zantac for the next 2-3 weeks twice daily while we try Prilosec to see if this helps you more long term with heartburn. Prilosec is just once daily. You can stop using Zantac after 2-3 weeks, but continue Prilosec. For sleep, you can use hydroxyzine (Vistaril) at bedtime. If you develop chest pain again along with sweating, belly pain, heart racing, nausea or vomiting, you need to report to the ER or call 911 immediately as your heart may be in trouble. Otherwise, we will refer you to [facility] so that they can look into your heart health.     Nonspecific Chest Pain Chest pain can be caused by many different conditions. There is always a chance that your pain could be related to something serious, such as a heart attack or a blood clot in your lungs. Chest pain can also be caused by conditions that are not life-threatening. If you have chest pain, it is very important to follow up with your health care provider. What are the causes? Causes of this condition include:  Heartburn.  Pneumonia or bronchitis.  Anxiety or stress.  Inflammation around your heart (pericarditis) or lung (pleuritis or pleurisy).  A blood clot in your lung.  A collapsed lung (pneumothorax). This can develop suddenly on its own (spontaneous pneumothorax) or from trauma to the chest.  Shingles infection (varicella-zoster virus).  Heart attack.  Damage to the bones, muscles, and cartilage that make up your chest wall. This can include: ? Bruised bones due to injury. ? Strained muscles or cartilage due to frequent or repeated coughing or overwork. ? Fracture to one or more ribs. ? Sore cartilage due to inflammation (costochondritis).  What increases the risk? Risk factors for this condition may include:  Activities that increase your risk for trauma or injury to your  chest.  Respiratory infections or conditions that cause frequent coughing.  Medical conditions or overeating that can cause heartburn.  Heart disease or family history of heart disease.  Conditions or health behaviors that increase your risk of developing a blood clot.  Having had chicken pox (varicella zoster).  What are the signs or symptoms? Chest pain can feel like:  Burning or tingling on the surface of your chest or deep in your chest.  Crushing, pressure, aching, or squeezing pain.  Dull or sharp pain that is worse when you move, cough, or take a deep breath.  Pain that is also felt in your back, neck, shoulder, or arm, or pain that spreads to any of these areas.  Your chest pain may come and go, or it may stay constant. How is this diagnosed? Lab tests or other studies may be needed to find the cause of your pain. Your health care provider may have you take a test called an ECG (electrocardiogram). An ECG records your heartbeat patterns at the time the test is performed. You may also have other tests, such as:  Transthoracic echocardiogram (TTE). In this test, sound waves are used to create a picture of the heart structures and to look at how blood flows through your heart.  Transesophageal echocardiogram (TEE).This is a more advanced imaging test that takes images from inside your body. It allows your health care provider to see your heart in finer detail.  Cardiac monitoring. This allows your health care provider to monitor your heart rate and rhythm in real time.  Holter monitor. This is a portable  device that records your heartbeat and can help to diagnose abnormal heartbeats. It allows your health care provider to track your heart activity for several days, if needed.  Stress tests. These can be done through exercise or by taking medicine that makes your heart beat more quickly.  Blood tests.  Other imaging tests.  How is this treated? Treatment depends on what  is causing your chest pain. Treatment may include:  Medicines. These may include: ? Acid blockers for heartburn. ? Anti-inflammatory medicine. ? Pain medicine for inflammatory conditions. ? Antibiotic medicine, if an infection is present. ? Medicines to dissolve blood clots. ? Medicines to treat coronary artery disease (CAD).  Supportive care for conditions that do not require medicines. This may include: ? Resting. ? Applying heat or cold packs to injured areas. ? Limiting activities until pain decreases.  Follow these instructions at home: Medicines  If you were prescribed an antibiotic, take it as told by your health care provider. Do not stop taking the antibiotic even if you start to feel better.  Take over-the-counter and prescription medicines only as told by your health care provider. Lifestyle  Do not use any products that contain nicotine or tobacco, such as cigarettes and e-cigarettes. If you need help quitting, ask your health care provider.  Do not drink alcohol.  Make lifestyle changes as directed by your health care provider. These may include: ? Getting regular exercise. Ask your health care provider to suggest some activities that are safe for you. ? Eating a heart-healthy diet. A registered dietitian can help you to learn healthy eating options. ? Maintaining a healthy weight. ? Managing diabetes, if necessary. ? Reducing stress, such as with yoga or relaxation techniques. General instructions  Avoid any activities that bring on chest pain.  If heartburn is the cause for your chest pain, raise (elevate) the head of your bed about 6 inches (15 cm) by putting blocks under the legs. Sleeping with more pillows does not effectively relieve heartburn because it only changes the position of your head.  Keep all follow-up visits as told by your health care provider. This is important. This includes any further testing if your chest pain does not go away. Contact a  health care provider if:  Your chest pain does not go away.  You have a rash with blisters on your chest.  You have a fever.  You have chills. Get help right away if:  Your chest pain is worse.  You have a cough that gets worse, or you cough up blood.  You have severe pain in your abdomen.  You have severe weakness.  You faint.  You have sudden, unexplained chest discomfort.  You have sudden, unexplained discomfort in your arms, back, neck, or jaw.  You have shortness of breath at any time.  You suddenly start to sweat, or your skin gets clammy.  You feel nauseous or you vomit.  You suddenly feel light-headed or dizzy.  Your heart begins to beat quickly, or it feels like it is skipping beats. These symptoms may represent a serious problem that is an emergency. Do not wait to see if the symptoms will go away. Get medical help right away. Call your local emergency services (911 in the [place] Do not drive yourself to the hospital. This information is not intended to replace advice given to you by your health care provider. Make sure you discuss any questions you have with your health care provider. Document Released: 04/11/2005 Document Revised: 03/26/2016  Document Reviewed: 03/26/2016 Elsevier Interactive Patient Education  2017 [employer].     IF you received an x-ray today, you will receive an invoice from [facility]. Please contact [facility] at [phone] with questions or concerns regarding your invoice.   IF you received labwork today, you will receive an invoice from [facility]. Please contact [facility] at [phone] with questions or concerns regarding your invoice.   Our billing staff will not be able to assist you with questions regarding bills from these companies.  You will be contacted with the lab results as soon as they are available. The fastest way to get your results is to activate your My Chart account. Instructions are located  on the last page of this paperwork. If you have not heard from [place] regarding the results in 2 weeks, please contact this office.

## 2017-12-10 ENCOUNTER — Encounter: Payer: Self-pay | Admitting: Family Medicine

## 2018-01-03 ENCOUNTER — Ambulatory Visit: Payer: Self-pay | Admitting: Urgent Care

## 2018-05-14 ENCOUNTER — Encounter: Payer: Self-pay | Admitting: Urgent Care

## 2018-10-23 ENCOUNTER — Other Ambulatory Visit: Payer: Self-pay | Admitting: Urgent Care

## 2018-11-25 ENCOUNTER — Ambulatory Visit: Payer: Self-pay | Admitting: General Practice

## 2018-11-25 NOTE — Telephone Encounter (Signed)
Pt.'s wife reports he has had shortness of breath x 1 week and "it is getting worse." "Can not sleep at night - can't lay down." Denies chest pain. Has some wheezing. States pt. Says he needs to be seen. Instructed wife to take pt. To ED for evaluation.  Reason for Disposition . [1] MODERATE difficulty breathing (e.g., speaks in phrases, SOB even at rest, pulse 100-120) AND [2] NEW-onset or WORSE than normal  Answer Assessment - Initial Assessment Questions 1. RESPIRATORY STATUS: "Describe your breathing?" (e.g., wheezing, shortness of breath, unable to speak, severe coughing)      Shortness of breath 2. ONSET: "When did this breathing problem begin?"      1 week ago 3. PATTERN "Does the difficult breathing come and go, or has it been constant since it started?"      Constant 4. SEVERITY: "How bad is your breathing?" (e.g., mild, moderate, severe)    - MILD: No SOB at rest, mild SOB with walking, speaks normally in sentences, can lay down, no retractions, pulse < 100.    - MODERATE: SOB at rest, SOB with minimal exertion and prefers to sit, cannot lie down flat, speaks in phrases, mild retractions, audible wheezing, pulse 100-120.    - SEVERE: Very SOB at rest, speaks in single words, struggling to breathe, sitting hunched forward, retractions, pulse > 120      Moderate 5. RECURRENT SYMPTOM: "Have you had difficulty breathing before?" If so, ask: "When was the last time?" and "What happened that time?"      No 6. CARDIAC HISTORY: "Do you have any history of heart disease?" (e.g., heart attack, angina, bypass surgery, angioplasty)      No 7. LUNG HISTORY: "Do you have any history of lung disease?"  (e.g., pulmonary embolus, asthma, emphysema)     No 8. CAUSE: "What do you think is causing the breathing problem?"      Unsure 9. OTHER SYMPTOMS: "Do you have any other symptoms? (e.g., dizziness, runny nose, cough, chest pain, fever)     No 10. PREGNANCY: "Is there any chance you are pregnant?"  "When was your last menstrual period?"       n/a 11. TRAVEL: "Have you traveled out of the country in the last month?" (e.g., travel history, exposures)       No  Protocols used: BREATHING DIFFICULTY-A-AH

## 2019-11-16 IMAGING — DX DG KNEE COMPLETE 4+V*R*
4 series · 4 of 4 positions shown · non-contrast
Comparison: None.

CLINICAL DATA: Recurrent RIGHT knee pain and swelling.

EXAM:
RIGHT KNEE - COMPLETE 4+ VIEW

[knee ap]
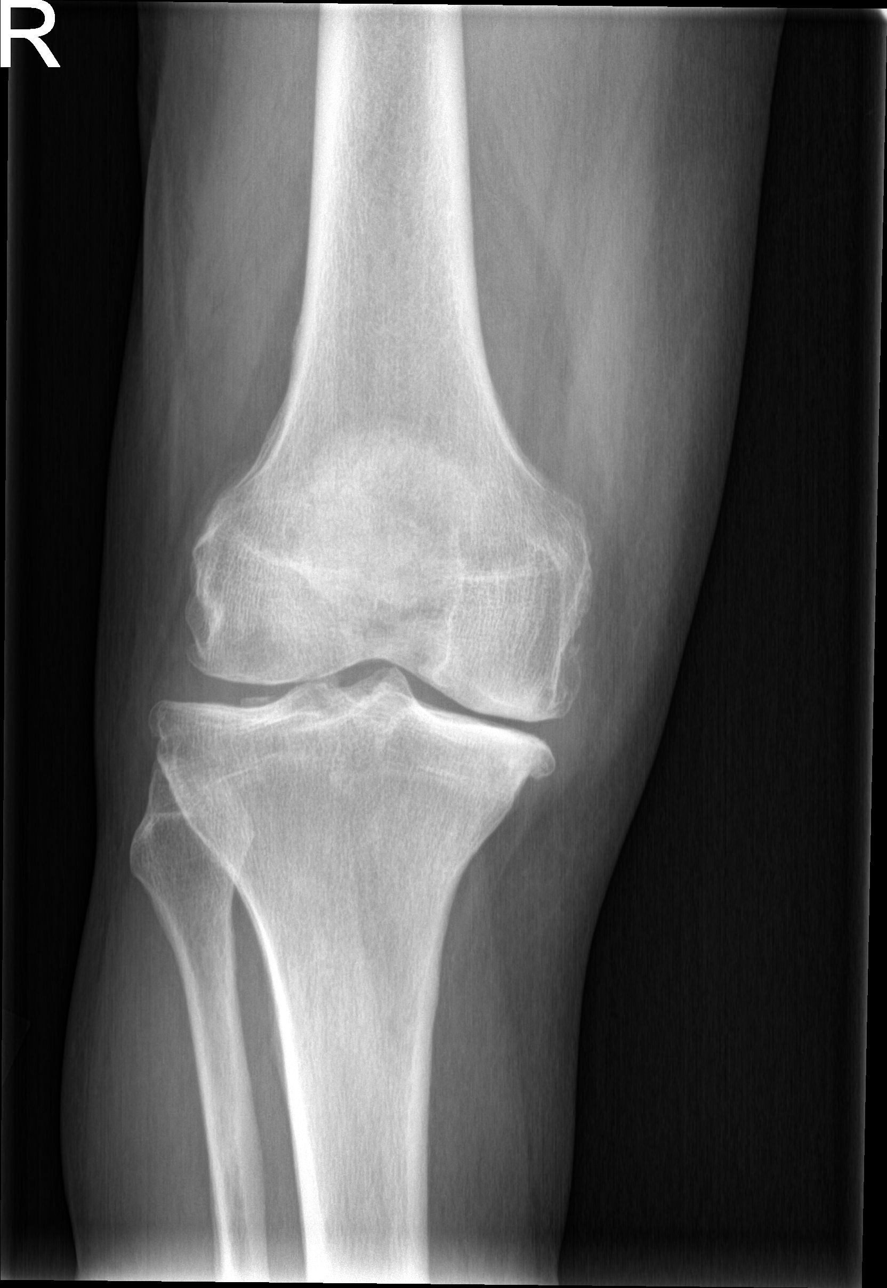

[knee obl (1 of 2)]
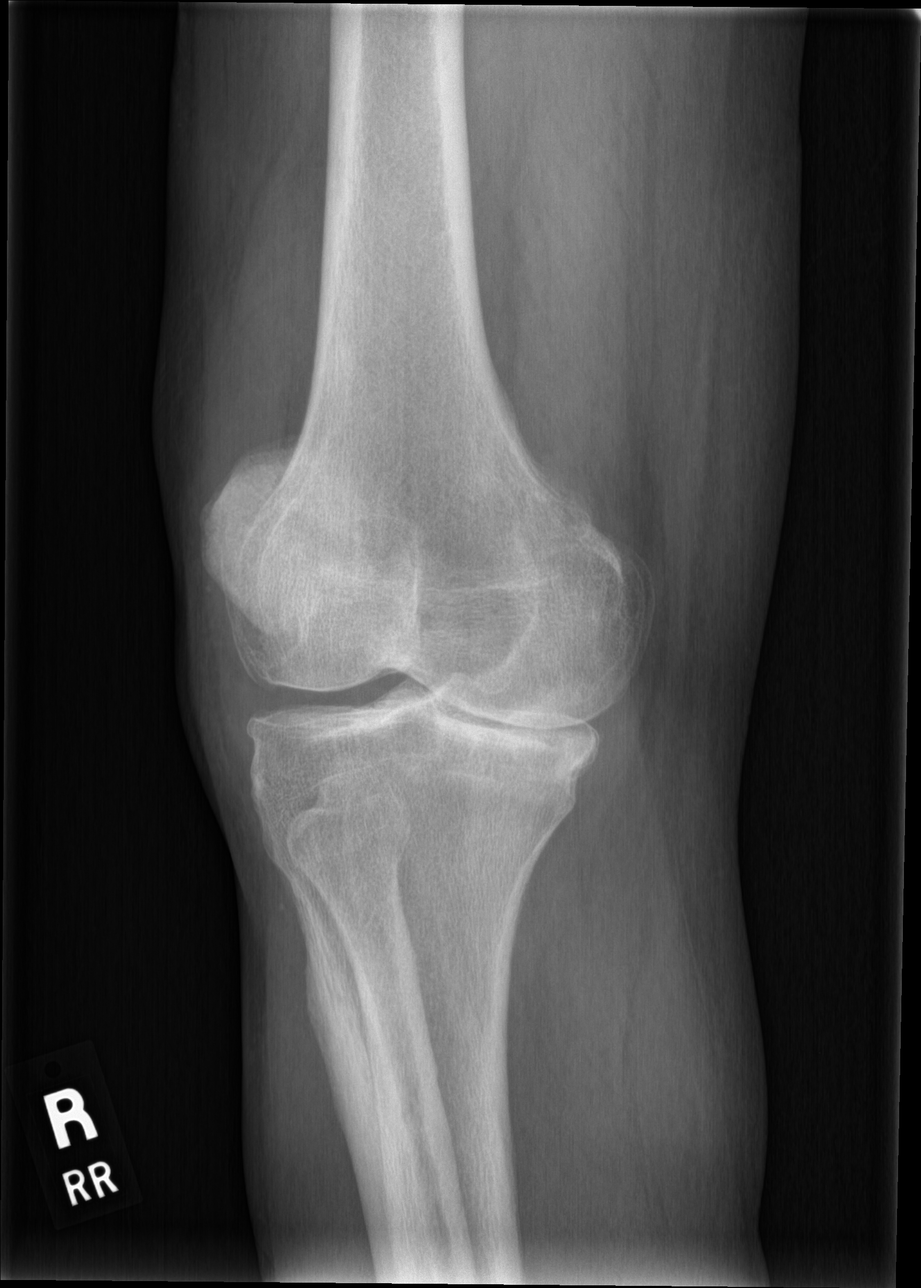

[knee obl (2 of 2)]
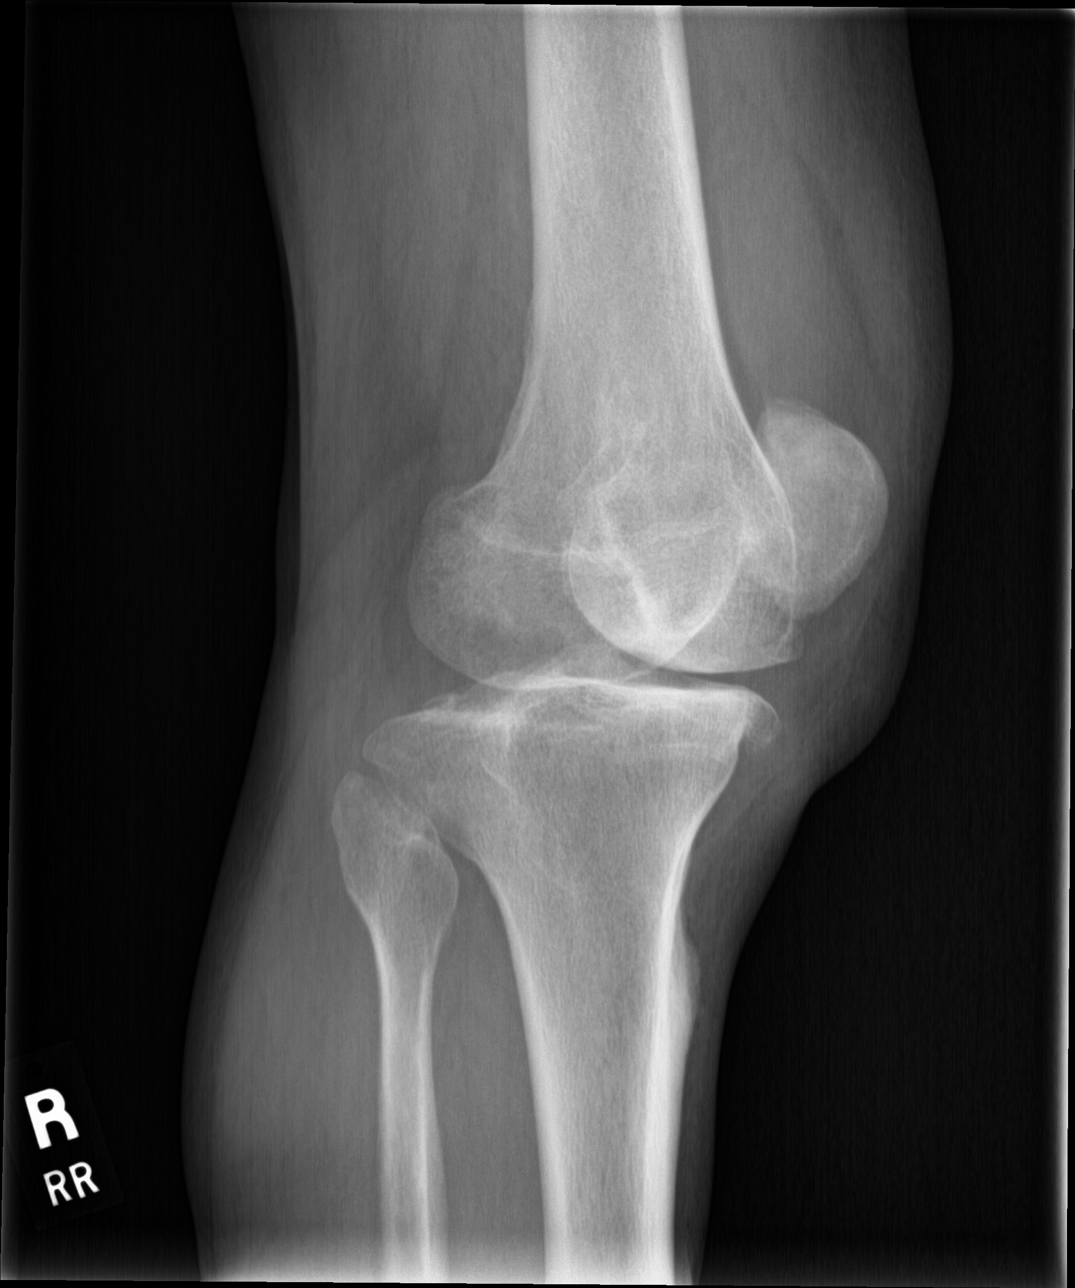

[knee lat]
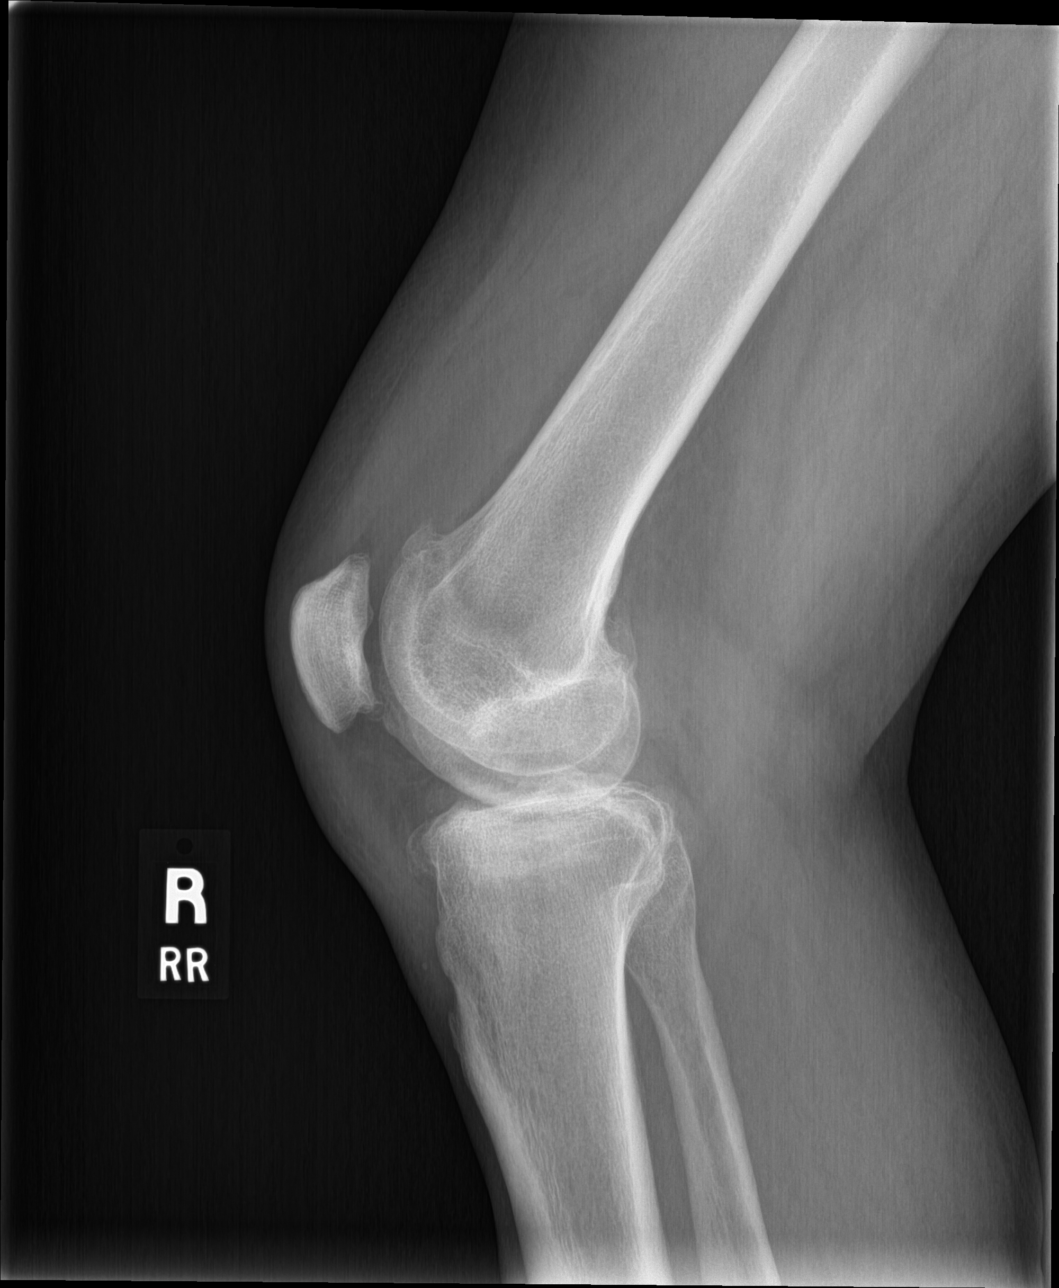

[4 of 4 positions shown; findings below may reference images not displayed]

FINDINGS: No fracture deformity or dislocation. Severe medial compartment
narrowing with periarticular sclerosis and marginal spurring. Mild
lateral and moderate patellofemoral compartment narrowing with
marginal spurring. No destructive bony lesions. Moderate
suprapatellar joint effusion. No subcutaneous gas or radiopaque
foreign bodies. No pathologic calcifications.
IMPRESSION: No acute fracture deformity or dislocation.

Tricompartmental osteoarthrosis, severe within medial compartment.

Suprapatellar joint effusion.

## 2021-01-20 ENCOUNTER — Ambulatory Visit: Payer: Medicare Other | Admitting: Cardiology

## 2021-01-20 ENCOUNTER — Encounter: Payer: Self-pay | Admitting: Cardiology

## 2021-01-20 ENCOUNTER — Other Ambulatory Visit: Payer: Self-pay

## 2021-01-20 VITALS — BP 105/76 | HR 112 | Temp 97.9°F | Ht 68.0 in | Wt 168.4 lb

## 2021-01-20 DIAGNOSIS — I5020 Unspecified systolic (congestive) heart failure: Secondary | ICD-10-CM

## 2021-01-20 DIAGNOSIS — I5023 Acute on chronic systolic (congestive) heart failure: Secondary | ICD-10-CM | POA: Insufficient documentation

## 2021-01-20 DIAGNOSIS — R778 Other specified abnormalities of plasma proteins: Secondary | ICD-10-CM

## 2021-01-20 DIAGNOSIS — R7989 Other specified abnormal findings of blood chemistry: Secondary | ICD-10-CM | POA: Insufficient documentation

## 2021-01-20 MED ORDER — FUROSEMIDE 40 MG PO TABS: 40.0000 mg | ORAL_TABLET | Freq: Every day | ORAL | 3 refills | Status: DC

## 2021-01-20 MED ORDER — ENTRESTO 24-26 MG PO TABS: 1.0000 | ORAL_TABLET | Freq: Two times a day (BID) | ORAL | 2 refills | Status: DC

## 2021-01-20 NOTE — Progress Notes (Signed)
Patient referred by [name], MD for elevated troponin  Subjective:   Logan Green, male    DOB: 07-09-1951, 70 y.o.   MRN: [id number]   Chief Complaint  Patient presents with   elevated troponin   New Patient (Initial Visit)     HPI  70 y.o. Caucasian male with h/o rheumatic fever in childhood, tobacco dependence, regular alcohol use, now with leg edema, dyspnea on exertion, and elevated troponin.  Patient was recently seen by PCP Dr. Vista Lawman, who checked troponin.  Troponin I was noted to be 55, with upper limit of normal being 47.  Patient had not seen a physician prior to this for several years.  He used to work on [employer] in [place] places [place] and [place].  He has been half to 1 pack a day smoker since age 36.  He also has been drinking anywhere from 1-3 drinks most days for several years.  He had history of rheumatic fever in childhood at age 14.  He does not know of any prior cardiac events.  He has been having bilateral leg swelling, dyspnea with minimal exertion, as well as orthopnea symptoms.  He was having chest pain episodes few months ago, but has not had any pain recently.  Patient is essentially not taking any medications at this time.  Past Medical History:  Diagnosis Date   Hypertension      History reviewed. No pertinent surgical history.   Social History   Tobacco Use  Smoking Status Every Day   Packs/day: 1.00   Years: 41.00   Pack years: 41.00   Types: Cigarettes  Smokeless Tobacco Never    Social History   Substance and Sexual Activity  Alcohol Use Yes     Family History  Problem Relation Age of Onset   Heart failure Mother      Current Outpatient Medications on File Prior to Visit  Medication Sig Dispense Refill   aspirin 81 MG tablet Take 1 tablet (81 mg total) by mouth daily. (Patient not taking: Reported on 01/20/2021) 90 tablet 1   atorvastatin (LIPITOR) 40 MG tablet Take 1 tablet (40 mg total) by mouth  daily. (Patient not taking: Reported on 01/20/2021) 90 tablet 3   hydrOXYzine (VISTARIL) 25 MG capsule Take 1 capsule (25 mg total) by mouth daily as needed. (Patient not taking: Reported on 01/20/2021) 30 capsule 5   ketoconazole (NIZORAL) 2 % cream Apply 1 application topically daily. (Patient not taking: No sig reported) 30 g 1   meloxicam (MOBIC) 7.5 MG tablet Take 1 tablet (7.5 mg total) by mouth daily. (Patient not taking: No sig reported) 30 tablet 5   omeprazole (PRILOSEC) 20 MG capsule Take 1 capsule (20 mg total) by mouth daily. (Patient not taking: Reported on 01/20/2021) 30 capsule 3   ranitidine (ZANTAC) 150 MG tablet Take 1 tablet (150 mg total) by mouth 2 (two) times daily. (Patient not taking: Reported on 01/20/2021) 60 tablet 0   No current facility-administered medications on file prior to visit.    Cardiovascular and other pertinent studies:  EKG 01/20/2021: Probable sinus tachycardia 105 bpm Frequent PACs  Old inferior infarct Lateral nonspecific ST-T changes   Recent labs: 01/19/2021: Glucose 87, BUN/Cr 21/1.16. EGFR ?. Na/K 134/4.5. Rest of the CMP normal H/H 15/44. MCV 102. Platelets 160 HbA1C N/A Chol 128, TG 109, HDL 31, LDL 78 TSH N/A BNP 3394 (<100) Trop I 54 (<47)    Review of Systems  Cardiovascular:  Positive for dyspnea on exertion, leg swelling and orthopnea. Negative for chest pain, palpitations and syncope.        Vitals:   01/20/21 1303  BP: 105/76  Pulse: (!) 112  Temp: 97.9 F (36.6 C)  SpO2: 96%     Body mass index is 25.61 kg/m. Filed Weights   01/20/21 1303  Weight: 168 lb 6.4 oz (76.4 kg)     Objective:   Physical Exam Vitals and nursing note reviewed.  Constitutional:      General: He is not in acute distress. Neck:     Vascular: No JVD.  Cardiovascular:     Rate and Rhythm: Normal rate and regular rhythm.     Heart sounds: Normal heart sounds. No murmur heard. Pulmonary:     Effort: Pulmonary effort is normal.      Breath sounds: Normal breath sounds. No wheezing or rales.  Abdominal:     Comments: Hepatomegaly  Musculoskeletal:     Right lower leg: Edema (3+) present.     Left lower leg: Edema (3+) present.        Assessment & Recommendations:   70 y.o. Caucasian male with h/o rheumatic fever in childhood, tobacco dependence, regular alcohol use, now with acute decompensated heart failure, likely systolic   Acute systolic heart failure: Patient is NYHA class III-IV heart failure with massive fluid overload. BNP >3300.  Troponin elevation secondary to heart failure Etiology could be either of the following: Ischemic cardiomyopathy (has inferior infarct on EKG), alcoholic cardiomyopathy (has history of alcohol use, MCV was 100), or less likely valvular cardiomyopathy (history of rheumatic fever as a child, but no significant murmur on exam) Ideally, I would like to admit the patient for IV diuresis and further work-up.  However, patient would not tolerate hospitalizations as possible. Started Entresto 24-26 mg twice daily, Lasix 20 mg daily. He has been prescribed lisinopril but hydrochlorothiazide in the past, but has not been taking this for several days.  I confirmed this with the patient. Will obtain echocardiogram and follow-up visit next week.  In order to avoid hospitalization, I will see him frequently for the next several days or weeks in the office.  Thank you for referring the patient to [place]. Please feel free to contact with any questions.   Nigel Mormon, MD Pager: [phone] Office: [phone]

## 2021-01-24 ENCOUNTER — Ambulatory Visit: Payer: Medicare Other | Admitting: Cardiology

## 2021-01-24 ENCOUNTER — Ambulatory Visit: Payer: Medicare Other

## 2021-01-24 ENCOUNTER — Encounter: Payer: Self-pay | Admitting: Cardiology

## 2021-01-24 ENCOUNTER — Other Ambulatory Visit: Payer: Self-pay

## 2021-01-24 VITALS — BP 112/60 | HR 66 | Temp 98.3°F | Resp 17 | Ht 68.0 in | Wt 169.6 lb

## 2021-01-24 DIAGNOSIS — I5023 Acute on chronic systolic (congestive) heart failure: Secondary | ICD-10-CM

## 2021-01-24 MED ORDER — SPIRONOLACTONE 25 MG PO TABS: 25.0000 mg | ORAL_TABLET | Freq: Every day | ORAL | 3 refills | Status: DC

## 2021-01-24 MED ORDER — ENTRESTO 24-26 MG PO TABS: 1.0000 | ORAL_TABLET | Freq: Two times a day (BID) | ORAL | 0 refills | Status: DC

## 2021-01-24 NOTE — Progress Notes (Signed)
Patient referred by Logan Mccreedy, MD for elevated troponin  Subjective:   Logan Green, male    DOB: 07/05/1951, 70 y.o.   MRN: [id number]   Chief Complaint  Patient presents with   Follow-up   Acute on chronic systolic heart failure ([place])     HPI  70 y.o. Caucasian male with h/o rheumatic fever in childhood, tobacco dependence, regular alcohol use, now with leg edema, dyspnea on exertion, and elevated troponin.  Patient underwent echocardiogram today, details below.  Briefly, it shows mildly dilated LV, severe biventricular failure, mild to moderate MR, TR. patient is here today with his ex-wife Logan Green, who lives with him and takes care of his medications.  She confirms that he was not taking any medications up until last week.  However, he has now resumed his medications that include lisinopril 10 mg daily.  He is also taking Entresto.  Fortunately, he has not had any angioedema symptoms.  He has noticed some improvement in his shortness of breath and leg edema. However, it still persists.  Initial consultation HPI 01/20/2021: Patient was recently seen by PCP Dr. Vista Green, who checked troponin.  Troponin I was noted to be 55, with upper limit of normal being 47.  Patient had not seen a physician prior to this for several years.  He used to work on [employer] in [place] places [place] and [place].  He has been half to 1 pack a day smoker since age 36.  He also has been drinking anywhere from 1-3 drinks most days for several years.  He had history of rheumatic fever in childhood at age 36.  He does not know of any prior cardiac events.  He has been having bilateral leg swelling, dyspnea with minimal exertion, as well as orthopnea symptoms.  He was having chest pain episodes few months ago, but has not had any pain recently.  Patient is essentially not taking any medications at this time.   Current Outpatient Medications on File Prior to Visit  Medication Sig Dispense Refill    albuterol (VENTOLIN HFA) 108 (90 Base) MCG/ACT inhaler Inhale 1 puff into the lungs daily as needed.     amLODipine (NORVASC) 5 MG tablet Take 5 mg by mouth daily.     aspirin 81 MG tablet Take 1 tablet (81 mg total) by mouth daily. 90 tablet 1   atorvastatin (LIPITOR) 40 MG tablet Take 1 tablet (40 mg total) by mouth daily. 90 tablet 3   furosemide (LASIX) 40 MG tablet Take 1 tablet (40 mg total) by mouth daily. 30 tablet 3   isosorbide mononitrate (IMDUR) 30 MG 24 hr tablet Take 30 mg by mouth every morning.     metoprolol succinate (TOPROL-XL) 25 MG 24 hr tablet Take 25 mg by mouth daily.     omeprazole (PRILOSEC) 20 MG capsule Take 1 capsule (20 mg total) by mouth daily. 30 capsule 3   predniSONE (DELTASONE) 20 MG tablet Take 20 mg by mouth daily with breakfast.     sacubitril-valsartan (ENTRESTO) 24-26 MG Take 1 tablet by mouth 2 (two) times daily. 60 tablet 2   No current facility-administered medications on file prior to visit.    Cardiovascular and other pertinent studies:  Echocardiogram 01/24/2021: Left ventricle cavity is moderately dilated. Normal left ventricular wall thickness. Severe global hypokinesis with basal anteroseptal, anterior akinesis. LVEF 20-25%. Doppler evidence of grade III (restrictive) diastolic dysfunction, elevated LAP. Right ventricle cavity is mildly dilated. Moderately reduced right ventricular function.  Left atrial cavity is moderately dilated. Mild to moderate mitral regurgitation. Mild to moderate tricuspid regurgitation. Estimated pulmonary artery systolic pressure 30 mmHg.   EKG 01/20/2021: Probable sinus tachycardia 105 bpm Frequent PACs  Old inferior infarct Lateral nonspecific ST-T changes   Recent labs: 01/19/2021: Glucose 87, BUN/Cr 21/1.16. EGFR ?. Na/K 134/4.5. Rest of the CMP normal H/H 15/44. MCV 102. Platelets 160 HbA1C N/A Chol 128, TG 109, HDL 31, LDL 78 TSH N/A BNP 3394 (<100) Trop I 54 (<47)    Review of Systems   Cardiovascular:  Positive for dyspnea on exertion (Improving), leg swelling (Improving) and orthopnea (Improving). Negative for chest pain, palpitations and syncope.        Vitals:   01/24/21 1207 01/24/21 1222  BP: 94/60 112/60  Pulse: 83 66  Resp: 17   Temp: 98.3 F (36.8 C)   SpO2: 97% 99%     Body mass index is 25.61 kg/m. Filed Weights   01/24/21 1207  Weight: 169 lb 9.6 oz (76.9 kg)      Objective:   Physical Exam Vitals and nursing note reviewed.  Constitutional:      General: He is not in acute distress. Neck:     Vascular: No JVD.  Cardiovascular:     Rate and Rhythm: Normal rate and regular rhythm.     Heart sounds: Normal heart sounds. No murmur heard. Pulmonary:     Effort: Pulmonary effort is normal.     Breath sounds: Normal breath sounds. No wheezing or rales.  Abdominal:     Comments: Hepatomegaly  Musculoskeletal:     Right lower leg: Edema (2+) present.     Left lower leg: Edema (2+) present.        Assessment & Recommendations:   70 y.o. Caucasian male with h/o rheumatic fever in childhood, tobacco dependence, regular alcohol use, now with acute systolic heart failure  Acute systolic heart failure: NYHA class III-IV heart failure with fluid overload. 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 Stop lisinopril.  Hold Entresto, resume on 7/14. Stopped amlodipine.  Start spironolactone 25 mg daily. Continue metoprolol succinate 25 mg daily.   In future visits, will attempt to add [place]. Continue Lasix 20 mg twice daily for now.  In future, I would hope to make Lasix as needed after stabilization of his heart failure.   Emphasized importance of alcohol abstinence. Given possibility of ischemic cardiomyopathy, continue aspirin, statin, primary.  Will continue medical management for heart failure.  He remains reluctant to get admitted to the hospital.  He  is going to get his supplemental insurance 02/04/2021. He would like to hold off further work-up until then, if possible. After 02/04/2021, recommend returning for catheterization, coronary angiogram and possible intervention, as well as 2-week light cardiac telemetry given his previous episodes of syncope.  Obtain labs on 01/31/2021.  I will see him back for follow-up on 01/31/2021.   Nigel Mormon, MD Pager: [phone] Office: [phone]

## 2021-01-31 ENCOUNTER — Encounter: Payer: Self-pay | Admitting: Cardiology

## 2021-01-31 ENCOUNTER — Ambulatory Visit: Payer: Medicare Other | Admitting: Cardiology

## 2021-01-31 ENCOUNTER — Other Ambulatory Visit: Payer: Self-pay

## 2021-01-31 VITALS — BP 115/71 | HR 83 | Temp 98.6°F | Resp 16 | Ht 68.0 in | Wt 160.6 lb

## 2021-01-31 DIAGNOSIS — I5020 Unspecified systolic (congestive) heart failure: Secondary | ICD-10-CM

## 2021-01-31 LAB — BASIC METABOLIC PANEL
BUN/Creatinine Ratio: 14 (ref 10–24)
BUN: 15 mg/dL (ref 8–27)
CO2: 30 mmol/L — ABNORMAL HIGH (ref 20–29)
Calcium: 9.1 mg/dL (ref 8.6–10.2)
Chloride: 94 mmol/L — ABNORMAL LOW (ref 96–106)
Creatinine, Ser: 1.07 mg/dL (ref 0.76–1.27)
Glucose: 108 mg/dL — ABNORMAL HIGH (ref 65–99)
Potassium: 4.5 mmol/L (ref 3.5–5.2)
Sodium: 134 mmol/L (ref 134–144)
eGFR: 76 mL/min/{1.73_m2} (ref >59)

## 2021-01-31 LAB — PRO B NATRIURETIC PEPTIDE: NT-Pro BNP: 8645 pg/mL — ABNORMAL HIGH (ref 0–376)

## 2021-01-31 MED ORDER — FUROSEMIDE 20 MG PO TABS: 20.0000 mg | ORAL_TABLET | ORAL | 0 refills | Status: DC | PRN

## 2021-01-31 MED ORDER — DAPAGLIFLOZIN PROPANEDIOL 10 MG PO TABS: 10.0000 mg | ORAL_TABLET | Freq: Every day | ORAL | 3 refills | Status: DC

## 2021-01-31 NOTE — Progress Notes (Signed)
Patient referred by [name], MD for elevated troponin  Subjective:   Logan Green, male    DOB: 1950/10/27, 70 y.o.   MRN: [id number]   Chief Complaint  Patient presents with   Acute on chronic systolic heart failure    Follow-up     HPI  70 y.o. Caucasian male with h/o rheumatic fever in childhood, tobacco dependence, regular alcohol use, now with HFrEF  Patient has had improvement in his shortness of breath and leg edema.  She is not able to sleep issues on without much difficulty.  He has lost 8 pounds since his last visit last week.  He is tolerating medications well.  Recent labs reviewed with the patient, details below.  Initial consultation HPI 01/20/2021: Patient was recently seen by PCP Dr. Vista Lawman, who checked troponin.  Troponin I was noted to be 55, with upper limit of normal being 47.  Patient had not seen a physician prior to this for several years.  He used to work on [employer] [place] places [place].  He has been half to 1 pack a day smoker since age 71.  He also has been drinking anywhere from 1-3 drinks most days for several years.  He had history of rheumatic fever in childhood at age 66.  He does not know of any prior cardiac events.  He has been having bilateral leg swelling, dyspnea with minimal exertion, as well as orthopnea symptoms.  He was having chest pain episodes few months ago, but has not had any pain recently.  Patient is essentially not taking any medications at this time.   Current Outpatient Medications on File Prior to Visit  Medication Sig Dispense Refill   albuterol (VENTOLIN HFA) 108 (90 Base) MCG/ACT inhaler Inhale 1 puff into the lungs daily as needed.     aspirin 81 MG tablet Take 1 tablet (81 mg total) by mouth daily. 90 tablet 1   atorvastatin (LIPITOR) 40 MG tablet Take 1 tablet (40 mg total) by mouth daily. 90 tablet 3   furosemide (LASIX) 40 MG tablet Take 1 tablet (40 mg total) by mouth daily.  (Patient taking differently: Take 20 mg by mouth 2 (two) times daily.) 30 tablet 3   isosorbide mononitrate (IMDUR) 30 MG 24 hr tablet Take 30 mg by mouth every morning.     metoprolol succinate (TOPROL-XL) 25 MG 24 hr tablet Take 25 mg by mouth daily.     omeprazole (PRILOSEC) 20 MG capsule Take 1 capsule (20 mg total) by mouth daily. 30 capsule 3   sacubitril-valsartan (ENTRESTO) 24-26 MG Take 1 tablet by mouth 2 (two) times daily. Resume on 01/26/2021 1 tablet 0   spironolactone (ALDACTONE) 25 MG tablet Take 1 tablet (25 mg total) by mouth daily. 30 tablet 3   No current facility-administered medications on file prior to visit.    Cardiovascular and other pertinent studies:  Echocardiogram 01/24/2021: Left ventricle cavity is moderately dilated. Normal left ventricular wall thickness. Severe global hypokinesis with basal anteroseptal, anterior akinesis. LVEF 20-25%. Doppler evidence of grade III (restrictive) diastolic dysfunction, elevated LAP. Right ventricle cavity is mildly dilated. Moderately reduced right ventricular function. Left atrial cavity is moderately dilated. Mild to moderate mitral regurgitation. Mild to moderate tricuspid regurgitation. Estimated pulmonary artery systolic pressure 30 mmHg.   EKG 01/20/2021: Probable sinus tachycardia 105 bpm Frequent PACs  Old inferior infarct Lateral nonspecific ST-T changes   Recent labs: 01/30/2021: Glucose 108, BUN/Cr 15/1.07. EGFR 76. Na/K  134/4.5.  NT-Pro BNP 8645 (0-376)  01/19/2021: Glucose 87, BUN/Cr 21/1.16. EGFR ?. Na/K 134/4.5. Rest of the CMP normal H/H 15/44. MCV 102. Platelets 160 HbA1C N/A Chol 128, TG 109, HDL 31, LDL 78 TSH N/A BNP 3394 (<100) Trop I 54 (<47)    Review of Systems  Cardiovascular:  Positive for dyspnea on exertion (Improving), leg swelling (Improving) and orthopnea (Improving). Negative for chest pain, palpitations and syncope.        Vitals:   01/31/21 1535  BP: 115/71  Pulse: 83   Resp: 16  Temp: 98.6 F (37 C)  SpO2: 98%    Body mass index is 24.42 kg/m. Filed Weights   01/31/21 1535  Weight: 160 lb 9.6 oz (72.8 kg)      Objective:   Physical Exam Vitals and nursing note reviewed.  Constitutional:      General: He is not in acute distress. Neck:     Vascular: No JVD.  Cardiovascular:     Rate and Rhythm: Normal rate and regular rhythm.     Heart sounds: Normal heart sounds. No murmur heard. Pulmonary:     Effort: Pulmonary effort is normal.     Breath sounds: Normal breath sounds. No wheezing or rales.  Abdominal:     Comments: Hepatomegaly  Musculoskeletal:     Right lower leg: Edema (Trace) present.     Left lower leg: Edema (1+) present.        Assessment & Recommendations:   70 y.o. Caucasian male with h/o rheumatic fever in childhood, tobacco dependence, regular alcohol use, now with HFrEF  Acute on chronic systolic heart failure: NYHA class II-III Volume status improving, still remains mildly decompensated.   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 Currently on Entresto, resume on 7/14, spironolactone 25 mg daily, metoprolol succinate 25 mg daily.   Re-emphasized importance of alcohol abstinence. Given possibility of ischemic cardiomyopathy, continue aspirin, statin, primary.  Will continue medical management for heart failure.  He remains reluctant to get admitted to the hospital.  We will plan for left and right heart catheterization, coronary angiography and possible intervention on 02/14/2021.  After that, I will place him on 2-week cardiac telemetry to evaluate for any arrhythmias.      Nigel Mormon, MD Pager: [phone] Office: [phone]

## 2021-02-14 ENCOUNTER — Ambulatory Visit (HOSPITAL_COMMUNITY)
Admission: RE | Admit: 2021-02-14 | Discharge: 2021-02-14 | Disposition: A | Discharge status: Home / Self Care | Payer: Medicare (Managed Care) | Attending: Cardiology | Admitting: Cardiology

## 2021-02-14 ENCOUNTER — Ambulatory Visit (HOSPITAL_COMMUNITY)
Admission: RE | Disposition: A | Discharge status: Home / Self Care | Payer: Self-pay | Source: Home / Self Care | Attending: Cardiology

## 2021-02-14 ENCOUNTER — Other Ambulatory Visit: Payer: Self-pay

## 2021-02-14 DIAGNOSIS — I5020 Unspecified systolic (congestive) heart failure: Secondary | ICD-10-CM | POA: Insufficient documentation

## 2021-02-14 DIAGNOSIS — F17210 Nicotine dependence, cigarettes, uncomplicated: Secondary | ICD-10-CM | POA: Diagnosis not present

## 2021-02-14 DIAGNOSIS — Z7982 Long term (current) use of aspirin: Secondary | ICD-10-CM | POA: Insufficient documentation

## 2021-02-14 DIAGNOSIS — Z79899 Other long term (current) drug therapy: Secondary | ICD-10-CM | POA: Insufficient documentation

## 2021-02-14 HISTORY — PX: RIGHT/LEFT HEART CATH AND CORONARY ANGIOGRAPHY: CATH118266

## 2021-02-14 LAB — POCT I-STAT 7, (LYTES, BLD GAS, ICA,H+H)
Acid-base deficit: 2 mmol/L (ref 0.0–2.0)
Acid-base deficit: 2 mmol/L (ref 0.0–2.0)
Bicarbonate: 19.7 mmol/L — ABNORMAL LOW (ref 20.0–28.0)
Bicarbonate: 20.5 mmol/L (ref 20.0–28.0)
Calcium, Ion: 1.13 mmol/L — ABNORMAL LOW (ref 1.15–1.40)
Calcium, Ion: 1.17 mmol/L (ref 1.15–1.40)
HCT: 46 % (ref 39.0–52.0)
HCT: 49 % (ref 39.0–52.0)
Hemoglobin: 15.6 g/dL (ref 13.0–17.0)
Hemoglobin: 16.7 g/dL (ref 13.0–17.0)
O2 Saturation: 100 %
O2 Saturation: 100 %
Potassium: 4.8 mmol/L (ref 3.5–5.1)
Potassium: 5.1 mmol/L (ref 3.5–5.1)
Sodium: 136 mmol/L (ref 135–145)
Sodium: 136 mmol/L (ref 135–145)
TCO2: 21 mmol/L — ABNORMAL LOW (ref 22–32)
TCO2: 21 mmol/L — ABNORMAL LOW (ref 22–32)
pCO2 arterial: 26.9 mmHg — ABNORMAL LOW (ref 32.0–48.0)
pCO2 arterial: 30.2 mmHg — ABNORMAL LOW (ref 32.0–48.0)
pH, Arterial: 7.439 (ref 7.350–7.450)
pH, Arterial: 7.473 — ABNORMAL HIGH (ref 7.350–7.450)
pO2, Arterial: 171 mmHg — ABNORMAL HIGH (ref 83.0–108.0)
pO2, Arterial: 178 mmHg — ABNORMAL HIGH (ref 83.0–108.0)

## 2021-02-14 LAB — POCT I-STAT EG7
Acid-base deficit: 1 mmol/L (ref 0.0–2.0)
Acid-base deficit: 2 mmol/L (ref 0.0–2.0)
Acid-base deficit: 2 mmol/L (ref 0.0–2.0)
Acid-base deficit: 2 mmol/L (ref 0.0–2.0)
Acid-base deficit: 2 mmol/L (ref 0.0–2.0)
Acid-base deficit: 3 mmol/L — ABNORMAL HIGH (ref 0.0–2.0)
Bicarbonate: 20.5 mmol/L (ref 20.0–28.0)
Bicarbonate: 21.4 mmol/L (ref 20.0–28.0)
Bicarbonate: 22 mmol/L (ref 20.0–28.0)
Bicarbonate: 22.1 mmol/L (ref 20.0–28.0)
Bicarbonate: 22.4 mmol/L (ref 20.0–28.0)
Bicarbonate: 23 mmol/L (ref 20.0–28.0)
Calcium, Ion: 1.02 mmol/L — ABNORMAL LOW (ref 1.15–1.40)
Calcium, Ion: 1.12 mmol/L — ABNORMAL LOW (ref 1.15–1.40)
Calcium, Ion: 1.14 mmol/L — ABNORMAL LOW (ref 1.15–1.40)
Calcium, Ion: 1.14 mmol/L — ABNORMAL LOW (ref 1.15–1.40)
Calcium, Ion: 1.19 mmol/L (ref 1.15–1.40)
Calcium, Ion: 1.25 mmol/L (ref 1.15–1.40)
HCT: 45 % (ref 39.0–52.0)
HCT: 47 % (ref 39.0–52.0)
HCT: 47 % (ref 39.0–52.0)
HCT: 47 % (ref 39.0–52.0)
HCT: 47 % (ref 39.0–52.0)
HCT: 51 % (ref 39.0–52.0)
Hemoglobin: 15.3 g/dL (ref 13.0–17.0)
Hemoglobin: 16 g/dL (ref 13.0–17.0)
Hemoglobin: 16 g/dL (ref 13.0–17.0)
Hemoglobin: 16 g/dL (ref 13.0–17.0)
Hemoglobin: 16 g/dL (ref 13.0–17.0)
Hemoglobin: 17.3 g/dL — ABNORMAL HIGH (ref 13.0–17.0)
O2 Saturation: 65 %
O2 Saturation: 73 %
O2 Saturation: 76 %
O2 Saturation: 77 %
O2 Saturation: 77 %
O2 Saturation: 77 %
Potassium: 4.4 mmol/L (ref 3.5–5.1)
Potassium: 4.8 mmol/L (ref 3.5–5.1)
Potassium: 4.9 mmol/L (ref 3.5–5.1)
Potassium: 4.9 mmol/L (ref 3.5–5.1)
Potassium: 4.9 mmol/L (ref 3.5–5.1)
Potassium: 5.2 mmol/L — ABNORMAL HIGH (ref 3.5–5.1)
Sodium: 136 mmol/L (ref 135–145)
Sodium: 137 mmol/L (ref 135–145)
Sodium: 137 mmol/L (ref 135–145)
Sodium: 137 mmol/L (ref 135–145)
Sodium: 137 mmol/L (ref 135–145)
Sodium: 139 mmol/L (ref 135–145)
TCO2: 21 mmol/L — ABNORMAL LOW (ref 22–32)
TCO2: 22 mmol/L (ref 22–32)
TCO2: 23 mmol/L (ref 22–32)
TCO2: 23 mmol/L (ref 22–32)
TCO2: 23 mmol/L (ref 22–32)
TCO2: 24 mmol/L (ref 22–32)
pCO2, Ven: 32.9 mmHg — ABNORMAL LOW (ref 44.0–60.0)
pCO2, Ven: 33.5 mmHg — ABNORMAL LOW (ref 44.0–60.0)
pCO2, Ven: 34.1 mmHg — ABNORMAL LOW (ref 44.0–60.0)
pCO2, Ven: 34.7 mmHg — ABNORMAL LOW (ref 44.0–60.0)
pCO2, Ven: 34.8 mmHg — ABNORMAL LOW (ref 44.0–60.0)
pCO2, Ven: 38.3 mmHg — ABNORMAL LOW (ref 44.0–60.0)
pH, Ven: 7.388 (ref 7.250–7.430)
pH, Ven: 7.402 (ref 7.250–7.430)
pH, Ven: 7.406 (ref 7.250–7.430)
pH, Ven: 7.41 (ref 7.250–7.430)
pH, Ven: 7.411 (ref 7.250–7.430)
pH, Ven: 7.434 — ABNORMAL HIGH (ref 7.250–7.430)
pO2, Ven: 33 mmHg (ref 32.0–45.0)
pO2, Ven: 38 mmHg (ref 32.0–45.0)
pO2, Ven: 40 mmHg (ref 32.0–45.0)
pO2, Ven: 40 mmHg (ref 32.0–45.0)
pO2, Ven: 41 mmHg (ref 32.0–45.0)
pO2, Ven: 42 mmHg (ref 32.0–45.0)

## 2021-02-14 LAB — CBC
HCT: 52.7 % — ABNORMAL HIGH (ref 39.0–52.0)
Hemoglobin: 17.6 g/dL — ABNORMAL HIGH (ref 13.0–17.0)
MCH: 33.5 pg (ref 26.0–34.0)
MCHC: 33.4 g/dL (ref 30.0–36.0)
MCV: 100.4 fL — ABNORMAL HIGH (ref 80.0–100.0)
Platelets: 213 10*3/uL (ref 150–400)
RBC: 5.25 MIL/uL (ref 4.22–5.81)
RDW: 14.8 % (ref 11.5–15.5)
WBC: 7.9 10*3/uL (ref 4.0–10.5)
nRBC: 0 % (ref 0.0–0.2)

## 2021-02-14 SURGERY — RIGHT/LEFT HEART CATH AND CORONARY ANGIOGRAPHY
Anesthesia: LOCAL

## 2021-02-14 MED ORDER — SODIUM CHLORIDE 0.9% FLUSH: 3.0000 mL | Freq: Two times a day (BID) | INTRAVENOUS | Status: DC

## 2021-02-14 MED ORDER — SODIUM CHLORIDE 0.9 % IV SOLN: INTRAVENOUS | Status: DC

## 2021-02-14 MED ORDER — MIDAZOLAM HCL 2 MG/2ML IJ SOLN
INTRAMUSCULAR | Status: AC
  Filled 2021-02-14: qty 2

## 2021-02-14 MED ORDER — ACETAMINOPHEN 325 MG PO TABS: 650.0000 mg | ORAL_TABLET | ORAL | Status: DC | PRN

## 2021-02-14 MED ORDER — LIDOCAINE HCL (PF) 1 % IJ SOLN
INTRAMUSCULAR | Status: DC | PRN
  Administered 2021-02-14: 5 mL via INTRADERMAL

## 2021-02-14 MED ORDER — ASPIRIN 81 MG PO CHEW
81.0000 mg | CHEWABLE_TABLET | ORAL | Status: AC
  Administered 2021-02-14: 81 mg via ORAL
  Filled 2021-02-14: qty 1

## 2021-02-14 MED ORDER — HEPARIN (PORCINE) IN NACL 1000-0.9 UT/500ML-% IV SOLN
INTRAVENOUS | Status: DC | PRN
  Administered 2021-02-14 (×2): 500 mL

## 2021-02-14 MED ORDER — LABETALOL HCL 5 MG/ML IV SOLN: 10.0000 mg | INTRAVENOUS | Status: DC | PRN

## 2021-02-14 MED ORDER — HEPARIN SODIUM (PORCINE) 1000 UNIT/ML IJ SOLN
INTRAMUSCULAR | Status: AC
  Filled 2021-02-14: qty 1

## 2021-02-14 MED ORDER — SODIUM CHLORIDE 0.9% FLUSH: 3.0000 mL | INTRAVENOUS | Status: DC | PRN

## 2021-02-14 MED ORDER — HEPARIN SODIUM (PORCINE) 1000 UNIT/ML IJ SOLN
INTRAMUSCULAR | Status: DC | PRN
  Administered 2021-02-14: 4000 [IU] via INTRAVENOUS

## 2021-02-14 MED ORDER — FENTANYL CITRATE (PF) 100 MCG/2ML IJ SOLN
INTRAMUSCULAR | Status: AC
  Filled 2021-02-14: qty 2

## 2021-02-14 MED ORDER — LIDOCAINE HCL (PF) 1 % IJ SOLN
INTRAMUSCULAR | Status: AC
  Filled 2021-02-14: qty 30

## 2021-02-14 MED ORDER — HYDRALAZINE HCL 20 MG/ML IJ SOLN: 10.0000 mg | INTRAMUSCULAR | Status: DC | PRN

## 2021-02-14 MED ORDER — ONDANSETRON HCL 4 MG/2ML IJ SOLN: 4.0000 mg | Freq: Four times a day (QID) | INTRAMUSCULAR | Status: DC | PRN

## 2021-02-14 MED ORDER — SODIUM CHLORIDE 0.9 % IV SOLN: 250.0000 mL | INTRAVENOUS | Status: DC | PRN

## 2021-02-14 MED ORDER — VERAPAMIL HCL 2.5 MG/ML IV SOLN
INTRAVENOUS | Status: DC | PRN
  Administered 2021-02-14: 10 mL via INTRA_ARTERIAL

## 2021-02-14 MED ORDER — HEPARIN (PORCINE) IN NACL 1000-0.9 UT/500ML-% IV SOLN
INTRAVENOUS | Status: AC
  Filled 2021-02-14: qty 1000

## 2021-02-14 MED ORDER — SODIUM CHLORIDE 0.9 % IV SOLN: INTRAVENOUS | Status: AC

## 2021-02-14 SURGICAL SUPPLY — 11 items
CATH BALLN WEDGE 5F 110CM (CATHETERS) ×2 IMPLANT
CATH OPTITORQUE TIG 4.0 5F (CATHETERS) ×2 IMPLANT
DEVICE RAD COMP TR BAND LRG (VASCULAR PRODUCTS) ×2 IMPLANT
GLIDESHEATH SLEND SS 6F .021 (SHEATH) ×2 IMPLANT
GUIDEWIRE INQWIRE 1.5J.035X260 (WIRE) ×1 IMPLANT
INQWIRE 1.5J .035X260CM (WIRE) ×2
KIT HEART LEFT (KITS) ×2 IMPLANT
PACK CARDIAC CATHETERIZATION (CUSTOM PROCEDURE TRAY) ×2 IMPLANT
SHEATH GLIDE SLENDER 4/5FR (SHEATH) ×2 IMPLANT
TRANSDUCER W/STOPCOCK (MISCELLANEOUS) ×2 IMPLANT
TUBING CIL FLEX 10 FLL-RA (TUBING) ×2 IMPLANT

## 2021-02-14 NOTE — Progress Notes (Signed)
Post procedure, pt's BP noted to be in the 80's systolic a few times. Manual BP taken twice and shown below. Dr. Rosemary Holms called and notified. Pt completely asymptomatic and states this happens at home. Dr. Rosemary Holms states it is okay for pt to go home with BP in 80's systolic with specific instructions to inform pt to drink plenty of fluids for the next 24 hours. No orders given to hold home medications.    02/14/21 1720 02/14/21 1725  Vitals  BP (!) 86/54 (manual - LUE) (!) 84/58 (manual - L calf)

## 2021-02-14 NOTE — Interval H&P Note (Signed)
History and Physical Interval Note:  02/14/2021 2:24 PM  Logan Green  has presented today for surgery, with the diagnosis of heart failure.  The various methods of treatment have been discussed with the patient and family. After consideration of risks, benefits and other options for treatment, the patient has consented to  Procedure(s): RIGHT/LEFT HEART CATH AND CORONARY ANGIOGRAPHY (N/A) as a surgical intervention.  The patient's history has been reviewed, patient examined, no change in status, stable for surgery.  I have reviewed the patient's chart and labs.  Questions were answered to the patient's satisfaction.    2012 Appropriate Use Criteria for Diagnostic Catheterization Cardiomyopathies (Right and Left Heart Catheterization OR Right Heart Catheterization Alone With/Without Left Ventriculography and Coronary Angiography) Indication:  Known or suspected cardiomyopathy with or without heart failure A (7) Indication: 93; Score 7   Layton Tappan J Amillya Chavira

## 2021-02-14 NOTE — H&P (Signed)
OV 7/19/222 copied for documentation     Patient referred by [name], MD for elevated troponin  Subjective:   Logan Green, male    DOB: 12/14/1950, 70 y.o.   MRN: [id number]   Chief Complaint  Patient presents with   Acute on chronic systolic heart failure    Follow-up     HPI  70 y.o. Caucasian male with h/o rheumatic fever in childhood, tobacco dependence, regular alcohol use, now with HFrEF  Patient has had improvement in his shortness of breath and leg edema.  She is not able to sleep issues on without much difficulty.  He has lost 8 pounds since his last visit last week.  He is tolerating medications well.  Recent labs reviewed with the patient, details below.  Initial consultation HPI 01/20/2021: Patient was recently seen by PCP Dr. Vista Lawman, who checked troponin.  Troponin I was noted to be 55, with upper limit of normal being 47.  Patient had not seen a physician prior to this for several years.  He used [place] work on [employer] [place]  He has been half to 1 pack a day smoker since age 8.  He also has been drinking anywhere from 1-3 drinks most days for several years.  He had history of rheumatic fever in childhood at age 66.  He does not know of any prior cardiac events.  He has been having bilateral leg swelling, dyspnea with minimal exertion, as well as orthopnea symptoms.  He was having chest pain episodes few months ago, but has not had any pain recently.  Patient is essentially not taking any medications at this time.   Current Outpatient Medications on File Prior to Visit  Medication Sig Dispense Refill   albuterol (VENTOLIN HFA) 108 (90 Base) MCG/ACT inhaler Inhale 1 puff into the lungs daily as needed.     aspirin 81 MG tablet Take 1 tablet (81 mg total) by mouth daily. 90 tablet 1   atorvastatin (LIPITOR) 40 MG tablet Take 1 tablet (40 mg total) by mouth daily. 90 tablet 3   furosemide (LASIX) 40 MG tablet Take 1  tablet (40 mg total) by mouth daily. (Patient taking differently: Take 20 mg by mouth 2 (two) times daily.) 30 tablet 3   isosorbide mononitrate (IMDUR) 30 MG 24 hr tablet Take 30 mg by mouth every morning.     metoprolol succinate (TOPROL-XL) 25 MG 24 hr tablet Take 25 mg by mouth daily.     omeprazole (PRILOSEC) 20 MG capsule Take 1 capsule (20 mg total) by mouth daily. 30 capsule 3   sacubitril-valsartan (ENTRESTO) 24-26 MG Take 1 tablet by mouth 2 (two) times daily. Resume on 01/26/2021 1 tablet 0   spironolactone (ALDACTONE) 25 MG tablet Take 1 tablet (25 mg total) by mouth daily. 30 tablet 3   No current facility-administered medications on file prior to visit.    Cardiovascular and other pertinent studies:  Echocardiogram 01/24/2021: Left ventricle cavity is moderately dilated. Normal left ventricular wall thickness. Severe global hypokinesis with basal anteroseptal, anterior akinesis. LVEF 20-25%. Doppler evidence of grade III (restrictive) diastolic dysfunction, elevated LAP. Right ventricle cavity is mildly dilated. Moderately reduced right ventricular function. Left atrial cavity is moderately dilated. Mild to moderate mitral regurgitation. Mild to moderate tricuspid regurgitation. Estimated pulmonary artery systolic pressure 30 mmHg.   EKG 01/20/2021: Probable sinus tachycardia 105 bpm Frequent PACs  Old inferior infarct Lateral nonspecific ST-T changes   Recent labs: 01/30/2021:  Glucose 108, BUN/Cr 15/1.07. EGFR 76. Na/K 134/4.5.  NT-Pro BNP 8645 (0-376)  01/19/2021: Glucose 87, BUN/Cr 21/1.16. EGFR ?. Na/K 134/4.5. Rest of the CMP normal H/H 15/44. MCV 102. Platelets 160 HbA1C N/A Chol 128, TG 109, HDL 31, LDL 78 TSH N/A BNP 3394 (<100) Trop I 54 (<47)    Review of Systems  Cardiovascular:  Positive for dyspnea on exertion (Improving), leg swelling (Improving) and orthopnea (Improving). Negative for chest pain, palpitations and syncope.        Vitals:    01/31/21 1535  BP: 115/71  Pulse: 83  Resp: 16  Temp: 98.6 F (37 C)  SpO2: 98%    Body mass index is 24.42 kg/m. Filed Weights   01/31/21 1535  Weight: 160 lb 9.6 oz (72.8 kg)      Objective:   Physical Exam Vitals and nursing note reviewed.  Constitutional:      General: He is not in acute distress. Neck:     Vascular: No JVD.  Cardiovascular:     Rate and Rhythm: Normal rate and regular rhythm.     Heart sounds: Normal heart sounds. No murmur heard. Pulmonary:     Effort: Pulmonary effort is normal.     Breath sounds: Normal breath sounds. No wheezing or rales.  Abdominal:     Comments: Hepatomegaly  Musculoskeletal:     Right lower leg: Edema (Trace) present.     Left lower leg: Edema (1+) present.        Assessment & Recommendations:   70 y.o. Caucasian male with h/o rheumatic fever in childhood, tobacco dependence, regular alcohol use, now with HFrEF  Acute on chronic systolic heart failure: NYHA class II-III Volume status improving, still remains mildly decompensated.   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 Currently on Entresto, resume on 7/14, spironolactone 25 mg daily, metoprolol succinate 25 mg daily.   Re-emphasized importance of alcohol abstinence. Given possibility of ischemic cardiomyopathy, continue aspirin, statin, primary.  Will continue medical management for heart failure.  He remains reluctant to get admitted to the hospital.  We will plan for left and right heart catheterization, coronary angiography and possible intervention on 02/14/2021.  After that, I will place him on 2-week cardiac telemetry to evaluate for any arrhythmias.      Nigel Mormon, MD Pager: [phone] Office: [phone]

## 2021-02-14 NOTE — Progress Notes (Signed)
Pt ambulated without difficulty or bleeding.   Discharged home with his ex-wife who will drive and stay with pt x 24 hrs. 

## 2021-02-15 ENCOUNTER — Encounter (HOSPITAL_COMMUNITY): Payer: Self-pay | Admitting: Cardiology

## 2021-02-20 MED FILL — Midazolam HCl Inj 2 MG/2ML (Base Equivalent): INTRAMUSCULAR | Qty: 2 | Status: AC

## 2021-02-20 MED FILL — Fentanyl Citrate Preservative Free (PF) Inj 100 MCG/2ML: INTRAMUSCULAR | Qty: 2 | Status: AC

## 2021-03-06 ENCOUNTER — Encounter: Payer: Self-pay | Admitting: Cardiology

## 2021-03-06 ENCOUNTER — Ambulatory Visit: Payer: Medicare (Managed Care) | Admitting: Cardiology

## 2021-03-06 ENCOUNTER — Other Ambulatory Visit: Payer: Self-pay

## 2021-03-06 VITALS — BP 67/36 | HR 89 | Temp 98.2°F | Resp 17 | Ht 68.0 in | Wt 144.0 lb

## 2021-03-06 DIAGNOSIS — I5020 Unspecified systolic (congestive) heart failure: Secondary | ICD-10-CM

## 2021-03-06 DIAGNOSIS — I2510 Atherosclerotic heart disease of native coronary artery without angina pectoris: Secondary | ICD-10-CM

## 2021-03-06 DIAGNOSIS — F17210 Nicotine dependence, cigarettes, uncomplicated: Secondary | ICD-10-CM

## 2021-03-06 DIAGNOSIS — I25118 Atherosclerotic heart disease of native coronary artery with other forms of angina pectoris: Secondary | ICD-10-CM | POA: Insufficient documentation

## 2021-03-06 MED ORDER — METOPROLOL SUCCINATE ER 25 MG PO TB24: 25.0000 mg | ORAL_TABLET | Freq: Every day | ORAL | 0 refills | Status: DC

## 2021-03-06 MED ORDER — NICOTINE 7 MG/24HR TD PT24: 7.0000 mg | MEDICATED_PATCH | Freq: Every day | TRANSDERMAL | 2 refills | Status: DC

## 2021-03-06 NOTE — Progress Notes (Signed)
Patient referred by [name], MD for elevated troponin  Subjective:   Logan Green, male    DOB: 1951-05-01, 70 y.o.   MRN: [id number]   No chief complaint on file.    HPI  70 y.o. Caucasian male with coronary artery disease, likely nonischemic cardiomyopathy, tobacco dependence, prior history of alcohol abuse.    Patient has experience "fogginess", but denies any presyncope or syncope symptoms.  He continues to have exertional dyspnea with walking to mailbox.  Unfortunately, he continues to smoke half pack a day.  Initial consultation HPI 01/20/2021: Patient was recently seen by PCP Dr. Vista Lawman, who checked troponin.  Troponin I was noted to be 55, with upper limit of normal being 47.  Patient had not seen a physician prior to this for several years.  He used to work on [employer] [place].  He has been half to 1 pack a day smoker since age 39.  He also has been drinking anywhere from 1-3 drinks most days for several years.  He had history of rheumatic fever in childhood at age 22.  He does not know of any prior cardiac events.  He has been having bilateral leg swelling, dyspnea with minimal exertion, as well as orthopnea symptoms.  He was having chest pain episodes few months ago, but has not had any pain recently.  Patient is essentially not taking any medications at this time.   Current Outpatient Medications on File Prior to Visit  Medication Sig Dispense Refill   albuterol (VENTOLIN HFA) 108 (90 Base) MCG/ACT inhaler Inhale 1 puff into the lungs daily as needed for wheezing or shortness of breath.     aspirin 81 MG tablet Take 1 tablet (81 mg total) by mouth daily. 90 tablet 1   atorvastatin (LIPITOR) 40 MG tablet Take 1 tablet (40 mg total) by mouth daily. 90 tablet 3   furosemide (LASIX) 20 MG tablet Take 1 tablet (20 mg total) by mouth as needed. (Patient taking differently: Take 20 mg by mouth 2 (two) times daily.) 1 tablet 0    isosorbide mononitrate (IMDUR) 30 MG 24 hr tablet Take 30 mg by mouth every morning.     metoprolol succinate (TOPROL-XL) 25 MG 24 hr tablet Take 25 mg by mouth daily.     omeprazole (PRILOSEC) 20 MG capsule Take 20 mg by mouth daily.     sacubitril-valsartan (ENTRESTO) 24-26 MG Take 1 tablet by mouth 2 (two) times daily. Resume on 01/26/2021 1 tablet 0   spironolactone (ALDACTONE) 25 MG tablet Take 1 tablet (25 mg total) by mouth daily. 30 tablet 3   No current facility-administered medications on file prior to visit.    Cardiovascular and other pertinent studies:  RHC/LHC 02/20/2021: LM: Normal LAD: Prox and mid medial calcification with mid focal 30% stenosis Lcx: Small, normal RCA: Mid 100% occlusion         Faint left-to-right collaterals filling diffusely disease PDA and PLA vessels   Normal filling pressures   Well compensated cardiomyopathy, likely nonischemic. Continue GDMT for HFrEF If exertional dyspnea persists in spite of optimal treatment for HFrEF, could consider MRI to assess viability in chronically occluded RCA territory.    Echocardiogram 01/24/2021: Left ventricle cavity is moderately dilated. Normal left ventricular wall thickness. Severe global hypokinesis with basal anteroseptal, anterior akinesis. LVEF 20-25%. Doppler evidence of grade III (restrictive) diastolic dysfunction, elevated LAP. Right ventricle cavity is mildly dilated. Moderately reduced right ventricular function. Left  atrial cavity is moderately dilated. Mild to moderate mitral regurgitation. Mild to moderate tricuspid regurgitation. Estimated pulmonary artery systolic pressure 30 mmHg.   EKG 01/20/2021: Probable sinus tachycardia 105 bpm Frequent PACs  Old inferior infarct Lateral nonspecific ST-T changes   Recent labs: 01/30/2021: Glucose 108, BUN/Cr 15/1.07. EGFR 76. Na/K 134/4.5.  NT-Pro BNP 8645 (0-376)  01/19/2021: Glucose 87, BUN/Cr 21/1.16. EGFR ?. Na/K 134/4.5. Rest of the CMP  normal H/H 15/44. MCV 102. Platelets 160 HbA1C N/A Chol 128, TG 109, HDL 31, LDL 78 TSH N/A BNP 3394 (<100) Trop I 54 (<47)    Review of Systems  Constitutional: Positive for decreased appetite.  Cardiovascular:  Positive for dyspnea on exertion (Improving). Negative for chest pain, leg swelling, orthopnea, palpitations and syncope.        Vitals:   03/06/21 1317 03/06/21 1319  BP:    Pulse:    Resp:    Temp:    SpO2: 95% 95%    Body mass index is 21.9 kg/m. Filed Weights   03/06/21 1316  Weight: 144 lb (65.3 kg)      Orthostatic VS for the past 72 hrs (Last 3 readings):  Orthostatic BP Patient Position BP Location Cuff Size Orthostatic Pulse  03/06/21 1339 (!) 70/61 Standing Left Arm Normal --  03/06/21 1338 (!) 80/60 Sitting Left Arm Normal --  03/06/21 1337 (!) 80/63 Supine Left Arm Normal --  03/06/21 1326 (!) 63/47 Standing Left Arm Normal 101  03/06/21 1324 (!) 141/131 Sitting Left Arm Normal 57  03/06/21 1323 (!) 83/57 Supine Left Arm Normal 97     Objective:   Physical Exam Vitals and nursing note reviewed.  Constitutional:      General: He is not in acute distress. Neck:     Vascular: No JVD.  Cardiovascular:     Rate and Rhythm: Normal rate and regular rhythm.     Heart sounds: Normal heart sounds. No murmur heard. Pulmonary:     Effort: Pulmonary effort is normal.     Breath sounds: Normal breath sounds. No wheezing or rales.  Musculoskeletal:     Right lower leg: No edema.     Left lower leg: No edema.        Assessment & Recommendations:   70 y.o. Caucasian male with coronary artery disease, likely nonischemic cardiomyopathy, tobacco dependence, prior history of alcohol abuse.    Chronic systolic heart failure: Likely nonischemic cardiomyopathy, with RCA CTO, nonobstructive disease LAD Severe biventricular failure, LVEF 25-30%, NYHA class II Currently on Entresto 24/26 mg bid, spironolactone 25 mg daily, metoprolol succinate 25 mg  daily.   Owing to his profound hypotension, I have discontinued spironolactone, Entresto, Lasix, and Imdur. Reasonable to continue metoprolol succinate 25 mg daily for now. Will reassess his blood pressure later this week. I have encouraged him to increase fluid intake, up to 3-4 L a day of water. Re-emphasized importance of alcohol abstinence. His persistent dyspnea in spite of being fairly euvolemic, is likely due to untreated COPD.  CAD: RCA CTO.  In absence of anginal symptoms, I do not recommend revascularization to chronically occluded RCA.   Continue Aspirin, statin, metoprolol succinate and HFrEF management  Nicotine dependence: Tobacco cessation counseling:  - Currently smoking 0.5 packs/day   - Patient was informed of the dangers of tobacco abuse including stroke, cancer, and MI, as well as benefits of tobacco cessation. - Patient is willing to quit at this time. - Approximately 5 mins were spent counseling patient cessation techniques.  We discussed various methods to help quit smoking, including deciding on a date to quit, joining a support group, pharmacological agents. Patient would like to use nicotine patch. - I will reassess his progress at the next follow-up visit    Nigel Mormon, MD Pager: [phone] Office: [phone]

## 2021-03-09 ENCOUNTER — Encounter: Payer: Self-pay | Admitting: Student

## 2021-03-09 ENCOUNTER — Other Ambulatory Visit: Payer: Self-pay

## 2021-03-09 ENCOUNTER — Ambulatory Visit: Payer: Medicare (Managed Care) | Admitting: Student

## 2021-03-09 VITALS — BP 98/62 | HR 83 | Temp 98.7°F | Ht 68.0 in | Wt 142.8 lb

## 2021-03-09 DIAGNOSIS — I2510 Atherosclerotic heart disease of native coronary artery without angina pectoris: Secondary | ICD-10-CM

## 2021-03-09 DIAGNOSIS — I5020 Unspecified systolic (congestive) heart failure: Secondary | ICD-10-CM

## 2021-03-09 NOTE — Progress Notes (Signed)
Patient referred by [name], MD for elevated troponin  Subjective:   Logan Green, male    DOB: 02/13/1951, 70 y.o.   MRN: [id number]   Chief Complaint  Patient presents with   Hypotension   Follow-up     HPI  70 y.o. Caucasian male with coronary artery disease, likely nonischemic cardiomyopathy, tobacco dependence, prior history of alcohol abuse.    Patient presents for follow-up of hypertension.  Patient was seen in our office 03/06/2021 by Dr. Virgina Jock at which time he was profoundly hypotensive, therefore discontinued spironolactone, Entresto, Lasix, and Imdur.  Continued metoprolol succinate 25 mg daily and increased fluid intake.  Patient reports since discontinuing medications lightheadedness and "fogginess" have both improved.  Denies syncope or presyncope.   Patient continues to have dyspnea on exertion.  He unfortunately continues to smoke half pack per day.  Current Outpatient Medications on File Prior to Visit  Medication Sig Dispense Refill   albuterol (VENTOLIN HFA) 108 (90 Base) MCG/ACT inhaler Inhale 1 puff into the lungs daily as needed for wheezing or shortness of breath.     aspirin 81 MG tablet Take 1 tablet (81 mg total) by mouth daily. 90 tablet 1   atorvastatin (LIPITOR) 40 MG tablet Take 1 tablet (40 mg total) by mouth daily. 90 tablet 3   metoprolol succinate (TOPROL-XL) 25 MG 24 hr tablet Take 1 tablet (25 mg total) by mouth daily. 1 tablet 0   nicotine (NICODERM CQ - DOSED IN MG/24 HR) 7 mg/24hr patch Place 1 patch (7 mg total) onto the skin daily. 28 patch 2   omeprazole (PRILOSEC) 20 MG capsule Take 20 mg by mouth daily.     No current facility-administered medications on file prior to visit.    Cardiovascular and other pertinent studies:  RHC/LHC 02/20/2021: LM: Normal LAD: Prox and mid medial calcification with mid focal 30% stenosis Lcx: Small, normal RCA: Mid 100% occlusion         Faint left-to-right collaterals filling  diffusely disease PDA and PLA vessels   Normal filling pressures   Well compensated cardiomyopathy, likely nonischemic. Continue GDMT for HFrEF If exertional dyspnea persists in spite of optimal treatment for HFrEF, could consider MRI to assess viability in chronically occluded RCA territory.   Echocardiogram 01/24/2021: Left ventricle cavity is moderately dilated. Normal left ventricular wall thickness. Severe global hypokinesis with basal anteroseptal, anterior akinesis. LVEF 20-25%. Doppler evidence of grade III (restrictive) diastolic dysfunction, elevated LAP. Right ventricle cavity is mildly dilated. Moderately reduced right ventricular function. Left atrial cavity is moderately dilated. Mild to moderate mitral regurgitation. Mild to moderate tricuspid regurgitation. Estimated pulmonary artery systolic pressure 30 mmHg.   EKG 01/20/2021: Probable sinus tachycardia 105 bpm Frequent PACs  Old inferior infarct Lateral nonspecific ST-T changes   Recent labs: 01/30/2021: Glucose 108, BUN/Cr 15/1.07. EGFR 76. Na/K 134/4.5.  NT-Pro BNP 8645 (0-376)  01/19/2021: Glucose 87, BUN/Cr 21/1.16. EGFR ?. Na/K 134/4.5. Rest of the CMP normal H/H 15/44. MCV 102. Platelets 160 HbA1C N/A Chol 128, TG 109, HDL 31, LDL 78 TSH N/A BNP 3394 (<100) Trop I 54 (<47)    Review of Systems  Constitutional: Positive for decreased appetite.  Cardiovascular:  Positive for dyspnea on exertion (Improving). Negative for chest pain, leg swelling, orthopnea, palpitations and syncope.  Neurological:  Light-headedness: improved.        Vitals:   03/09/21 1450 03/09/21 1451  BP:    Pulse:    Temp:    SpO2: 99%  97%    Body mass index is 21.71 kg/m. Filed Weights   03/09/21 1441  Weight: 142 lb 12.8 oz (64.8 kg)      Orthostatic VS for the past 72 hrs (Last 3 readings):  Orthostatic BP Patient Position BP Location Cuff Size Orthostatic Pulse  03/09/21 1451 (!) 84/59 Standing Left Arm -- 79   03/09/21 1450 97/65 Sitting Left Arm Normal 85  03/09/21 1449 100/67 Supine Left Arm Normal 77  03/09/21 1441 -- Sitting Left Arm Normal --    Objective:   Physical Exam Vitals reviewed.  Constitutional:      General: He is not in acute distress. Neck:     Vascular: No JVD.  Cardiovascular:     Rate and Rhythm: Normal rate and regular rhythm.     Heart sounds: Normal heart sounds. No murmur heard. Pulmonary:     Effort: Pulmonary effort is normal.     Breath sounds: Normal breath sounds. No wheezing or rales.  Musculoskeletal:     Right lower leg: No edema.     Left lower leg: No edema.   Physical exam is stable compared to previous.      Assessment & Recommendations:   69 y.o. Caucasian male with coronary artery disease, likely nonischemic cardiomyopathy, tobacco dependence, prior history of alcohol abuse.    Chronic systolic heart failure:  Likely nonischemic cardiomyopathy, with RCA CTO, nonobstructive disease LAD Severe biventricular failure, LVEF 25-30%, NYHA class II Due to profound hypotension discontinued spironolactone, Entresto, Lasix, and Imdur at last office visit. Blood pressure has improved at today's office visit. Continue metoprolol succinate 25 mg daily. Patient has worked on increasing fluid intake, encouraged him to continue to do so. Patient remains euvolemic on exam.  Likely underlying COPD is contributing to dyspnea on exertion. Reiterated the importance of tobacco and alcohol cessation.  CAD: RCA CTO.  Continue Aspirin, statin, metoprolol succinate  Nicotine dependence: Patient continues to smoke half a pack per day Spent 5 minutes counseling regarding tobacco cessation, patient does not appear motivated to quit.   Follow up in 4 weeks, sooner if needed, with Dr. Virgina Jock.    Alethia Berthold, PA-C 03/09/2021, 3:19 PM Office: [phone]

## 2021-04-07 ENCOUNTER — Ambulatory Visit: Payer: Medicare (Managed Care) | Admitting: Cardiology

## 2021-04-07 ENCOUNTER — Other Ambulatory Visit: Payer: Self-pay

## 2021-04-07 ENCOUNTER — Encounter: Payer: Self-pay | Admitting: Cardiology

## 2021-04-07 VITALS — BP 155/79 | HR 71 | Temp 98.1°F | Ht 68.0 in | Wt 154.0 lb

## 2021-04-07 DIAGNOSIS — I5020 Unspecified systolic (congestive) heart failure: Secondary | ICD-10-CM

## 2021-04-07 DIAGNOSIS — I2510 Atherosclerotic heart disease of native coronary artery without angina pectoris: Secondary | ICD-10-CM

## 2021-04-07 NOTE — Progress Notes (Signed)
Patient referred by [name], MD for elevated troponin  Subjective:   Logan Green, male    DOB: [birth year], 70 y.o.   MRN: [id number]   Chief Complaint  Patient presents with   HFrEF      HPI  70 y.o. Caucasian male with coronary artery disease, likely nonischemic cardiomyopathy, tobacco dependence, prior history of alcohol abuse.    Owing to his orthostatic hypotension, he was recently taken off Entresto and spironolactone.  His lightheadedness has resolved.  Blood pressure is now creeping up.  He has noticed occasional leg swelling, as mild exertional dyspnea.   Current Outpatient Medications on File Prior to Visit  Medication Sig Dispense Refill   albuterol (VENTOLIN HFA) 108 (90 Base) MCG/ACT inhaler Inhale 1 puff into the lungs daily as needed for wheezing or shortness of breath.     aspirin 81 MG tablet Take 1 tablet (81 mg total) by mouth daily. 90 tablet 1   atorvastatin (LIPITOR) 40 MG tablet Take 1 tablet (40 mg total) by mouth daily. 90 tablet 3   metoprolol succinate (TOPROL-XL) 25 MG 24 hr tablet Take 1 tablet (25 mg total) by mouth daily. 1 tablet 0   nicotine (NICODERM CQ - DOSED IN MG/24 HR) 7 mg/24hr patch Place 1 patch (7 mg total) onto the skin daily. (Patient not taking: Reported on 04/07/2021) 28 patch 2   omeprazole (PRILOSEC) 20 MG capsule Take 20 mg by mouth daily. (Patient not taking: Reported on 04/07/2021)     No current facility-administered medications on file prior to visit.    Cardiovascular and other pertinent studies:  RHC/LHC 02/20/2021: LM: Normal LAD: Prox and mid medial calcification with mid focal 30% stenosis Lcx: Small, normal RCA: Mid 100% occlusion         Faint left-to-right collaterals filling diffusely disease PDA and PLA vessels   Normal filling pressures   Well compensated cardiomyopathy, likely nonischemic. Continue GDMT for HFrEF If exertional dyspnea persists in spite of optimal treatment for HFrEF, could  consider MRI to assess viability in chronically occluded RCA territory.   Echocardiogram 01/24/2021: Left ventricle cavity is moderately dilated. Normal left ventricular wall thickness. Severe global hypokinesis with basal anteroseptal, anterior akinesis. LVEF 20-25%. Doppler evidence of grade III (restrictive) diastolic dysfunction, elevated LAP. Right ventricle cavity is mildly dilated. Moderately reduced right ventricular function. Left atrial cavity is moderately dilated. Mild to moderate mitral regurgitation. Mild to moderate tricuspid regurgitation. Estimated pulmonary artery systolic pressure 30 mmHg.   EKG 01/20/2021: Probable sinus tachycardia 105 bpm Frequent PACs  Old inferior infarct Lateral nonspecific ST-T changes   Recent labs: 01/30/2021: Glucose 108, BUN/Cr 15/1.07. EGFR 76. Na/K 134/4.5.  NT-Pro BNP 8645 (0-376)  01/19/2021: Glucose 87, BUN/Cr 21/1.16. EGFR ?. Na/K 134/4.5. Rest of the CMP normal H/H 15/44. MCV 102. Platelets 160 HbA1C N/A Chol 128, TG 109, HDL 31, LDL 78 TSH N/A BNP 3394 (<100) Trop I 54 (<47)    Review of Systems  Constitutional: Positive for weight gain (Appetite has improved).  Cardiovascular:  Positive for dyspnea on exertion (Improving). Negative for chest pain, leg swelling, orthopnea, palpitations and syncope.  Neurological:  Light-headedness: improved.        Vitals:   04/07/21 1438 04/07/21 1441  BP: (!) 154/73 (!) 155/79  Pulse: 69 71  Temp: 98.1 F (36.7 C)   SpO2: 100% 100%    Body mass index is 23.42 kg/m. Filed Weights   04/07/21 1438  Weight: 154 lb (69.9 kg)  Orthostatic VS for the past 72 hrs (Last 3 readings):  Patient Position BP Location Cuff Size  04/07/21 1441 Sitting Left Arm Normal  04/07/21 1438 Sitting Left Arm Normal     Objective:   Physical Exam Vitals reviewed.  Constitutional:      General: He is not in acute distress. Neck:     Vascular: No JVD.  Cardiovascular:     Rate and  Rhythm: Normal rate and regular rhythm.     Heart sounds: Normal heart sounds. No murmur heard. Pulmonary:     Effort: Pulmonary effort is normal.     Breath sounds: Normal breath sounds. No wheezing or rales.  Musculoskeletal:     Right lower leg: No edema (Trace).     Left lower leg: No edema.   Physical exam is stable compared to previous.      Assessment & Recommendations:   70 y.o. Caucasian male with coronary artery disease, likely nonischemic cardiomyopathy, tobacco dependence, prior history of alcohol abuse.    Chronic systolic heart failure:  Likely nonischemic cardiomyopathy, with RCA CTO, nonobstructive disease LAD Severe biventricular failure, LVEF 25-30%, NYHA class II Of the blood pressure is creeping back up, resume Entresto 24-26 mg twice daily.  Repeat BMP in 1 week.  Follow-up in 2 weeks.  If blood pressure and renal function will tolerate, would reintroduce spironolactone 12.5 mg daily and repeat labs a week later. Continue metoprolol succinate 25 mg daily.  CAD: RCA CTO.  Continue Aspirin, statin, metoprolol succinate  Follow-up in 2 weeks with Lawerance Cruel, PA   [employer], PA-C 04/07/2021, 3:04 PM Office: [phone]

## 2021-04-12 LAB — BASIC METABOLIC PANEL
BUN/Creatinine Ratio: 21 (ref 10–24)
BUN: 19 mg/dL (ref 8–27)
CO2: 24 mmol/L (ref 20–29)
Calcium: 9.3 mg/dL (ref 8.6–10.2)
Chloride: 102 mmol/L (ref 96–106)
Creatinine, Ser: 0.91 mg/dL (ref 0.76–1.27)
Glucose: 98 mg/dL (ref 70–99)
Potassium: 4.8 mmol/L (ref 3.5–5.2)
Sodium: 139 mmol/L (ref 134–144)
eGFR: 92 mL/min/{1.73_m2} (ref >59)

## 2021-04-12 NOTE — Progress Notes (Signed)
Note, will discuss at upcoming visit.

## 2021-04-12 NOTE — Progress Notes (Signed)
FYI

## 2021-04-20 NOTE — Progress Notes (Signed)
Patient referred by [name], MD for elevated troponin  Subjective:   Logan Green, male    DOB: 1950/11/07, 70 y.o.   MRN: [id number]   No chief complaint on file.    HPI  70 y.o. Caucasian male with coronary artery disease, likely nonischemic cardiomyopathy, tobacco dependence, prior history of alcohol abuse.    Patient presents for 2-week follow-up.  At last office visit resumed Entresto 24/26 mg twice daily, repeat BMP revealed stable renal function. Patient is feeling much improved compared to previous. His energy level has now improved and swelling has resolved. He is now working in the yard and has returned to normal activity without issue.   He does unfortunately continue to smoke. Denies chest pain, palpitations, dyspnea, syncope, near-syncope.   Current Outpatient Medications on File Prior to Visit  Medication Sig Dispense Refill   albuterol (VENTOLIN HFA) 108 (90 Base) MCG/ACT inhaler Inhale 1 puff into the lungs daily as needed for wheezing or shortness of breath.     aspirin 81 MG tablet Take 1 tablet (81 mg total) by mouth daily. 90 tablet 1   atorvastatin (LIPITOR) 40 MG tablet Take 1 tablet (40 mg total) by mouth daily. 90 tablet 3   metoprolol succinate (TOPROL-XL) 25 MG 24 hr tablet Take 1 tablet (25 mg total) by mouth daily. 1 tablet 0   omeprazole (PRILOSEC) 20 MG capsule Take 20 mg by mouth daily.     sacubitril-valsartan (ENTRESTO) 24-26 MG Take 1 tablet by mouth 2 (two) times daily.     No current facility-administered medications on file prior to visit.    Cardiovascular and other pertinent studies:  RHC/LHC 02/20/2021: LM: Normal LAD: Prox and mid medial calcification with mid focal 30% stenosis Lcx: Small, normal RCA: Mid 100% occlusion         Faint left-to-right collaterals filling diffusely disease PDA and PLA vessels   Normal filling pressures   Well compensated cardiomyopathy, likely nonischemic. Continue GDMT for HFrEF If  exertional dyspnea persists in spite of optimal treatment for HFrEF, could consider MRI to assess viability in chronically occluded RCA territory.   Echocardiogram 01/24/2021: Left ventricle cavity is moderately dilated. Normal left ventricular wall thickness. Severe global hypokinesis with basal anteroseptal, anterior akinesis. LVEF 20-25%. Doppler evidence of grade III (restrictive) diastolic dysfunction, elevated LAP. Right ventricle cavity is mildly dilated. Moderately reduced right ventricular function. Left atrial cavity is moderately dilated. Mild to moderate mitral regurgitation. Mild to moderate tricuspid regurgitation. Estimated pulmonary artery systolic pressure 30 mmHg.   EKG 01/20/2021: Probable sinus tachycardia 105 bpm Frequent PACs  Old inferior infarct Lateral nonspecific ST-T changes   Recent labs: 04/11/2021: Glucose 98, BUN 19, creatinine 0.91, GFR >60, sodium 139, potassium 4.8  01/30/2021: Glucose 108, BUN/Cr 15/1.07. EGFR 76. Na/K 134/4.5.  NT-Pro BNP 8645 (0-376)  01/19/2021: Glucose 87, BUN/Cr 21/1.16. EGFR ?. Na/K 134/4.5. Rest of the CMP normal H/H 15/44. MCV 102. Platelets 160 HbA1C N/A Chol 128, TG 109, HDL 31, LDL 78 TSH N/A BNP 3394 (<100) Trop I 54 (<47)    Review of Systems  Cardiovascular:  Positive for dyspnea on exertion (Improving). Negative for chest pain, claudication, leg swelling, near-syncope, orthopnea, palpitations, paroxysmal nocturnal dyspnea and syncope.  Respiratory:  Negative for shortness of breath.   Neurological:  Negative for dizziness and light-headedness.        Vitals:   04/21/21 1115  BP: 117/70  Pulse: 71  Resp: 16  Temp: 98.5 F (36.9 C)  SpO2: 99%    Body mass index is 23.32 kg/m. Filed Weights   04/21/21 1115  Weight: 153 lb 6.4 oz (69.6 kg)      Orthostatic VS for the past 72 hrs (Last 3 readings):  Patient Position BP Location Cuff Size  04/21/21 1115 Sitting Left Arm Normal    Objective:    Physical Exam Vitals reviewed.  Constitutional:      General: He is not in acute distress. Neck:     Vascular: No JVD.  Cardiovascular:     Rate and Rhythm: Normal rate and regular rhythm.     Heart sounds: Normal heart sounds. No murmur heard. Pulmonary:     Effort: Pulmonary effort is normal.     Breath sounds: Normal breath sounds. No wheezing or rales.  Musculoskeletal:     Right lower leg: No edema.     Left lower leg: No edema.   Physical exam is stable compared to previous.      Assessment & Recommendations:   70 y.o. Caucasian male with coronary artery disease, likely nonischemic cardiomyopathy, tobacco dependence, prior history of alcohol abuse.    Chronic systolic heart failure:  Likely nonischemic cardiomyopathy, with RCA CTO, nonobstructive disease LAD Severe biventricular failure, LVEF 25-30% Patient is tolerating addition of Entresto without issue.  However blood pressure is soft.  Although patient is asymptomatic at this point in regards to hypotension, will hold off on reinitiation of spironolactone at this time. Continue metoprolol succinate 25 mg daily. Will uptitrate guideline directed medical therapy, including spironolactone as tolerated in the future.  CAD: RCA CTO.  Continue Aspirin, statin, metoprolol succinate  Follow-up in 3 months, sooner if needed, for HFrEF and CAD.  We will consider repeat echocardiogram at that time.   Alethia Berthold, PA-C 04/21/2021, 12:32 PM Office: [phone]

## 2021-04-21 ENCOUNTER — Ambulatory Visit: Payer: Medicare (Managed Care) | Admitting: Student

## 2021-04-21 ENCOUNTER — Encounter: Payer: Self-pay | Admitting: Student

## 2021-04-21 ENCOUNTER — Other Ambulatory Visit: Payer: Self-pay

## 2021-04-21 VITALS — BP 117/70 | HR 71 | Temp 98.5°F | Resp 16 | Ht 68.0 in | Wt 153.4 lb

## 2021-04-21 DIAGNOSIS — I2510 Atherosclerotic heart disease of native coronary artery without angina pectoris: Secondary | ICD-10-CM

## 2021-04-21 DIAGNOSIS — I5020 Unspecified systolic (congestive) heart failure: Secondary | ICD-10-CM

## 2021-04-21 MED ORDER — FUROSEMIDE 20 MG PO TABS: 20.0000 mg | ORAL_TABLET | Freq: Every day | ORAL | 3 refills | Status: DC | PRN

## 2021-07-24 ENCOUNTER — Ambulatory Visit: Payer: Medicare (Managed Care) | Admitting: Student

## 2021-07-24 ENCOUNTER — Encounter: Payer: Self-pay | Admitting: Student

## 2021-07-24 ENCOUNTER — Other Ambulatory Visit: Payer: Self-pay

## 2021-07-24 VITALS — BP 146/72 | HR 72 | Temp 98.8°F | Ht 68.0 in | Wt 168.0 lb

## 2021-07-24 DIAGNOSIS — F17210 Nicotine dependence, cigarettes, uncomplicated: Secondary | ICD-10-CM

## 2021-07-24 DIAGNOSIS — I2510 Atherosclerotic heart disease of native coronary artery without angina pectoris: Secondary | ICD-10-CM

## 2021-07-24 DIAGNOSIS — I5020 Unspecified systolic (congestive) heart failure: Secondary | ICD-10-CM

## 2021-07-24 MED ORDER — SPIRONOLACTONE 25 MG PO TABS: 12.5000 mg | ORAL_TABLET | Freq: Every day | ORAL | 3 refills | Status: DC

## 2021-07-24 NOTE — Progress Notes (Signed)
Patient referred by [name], MD for elevated troponin  Subjective:   Logan Green, male    DOB: [birth year], 71 y.o.   MRN: [id number]   No chief complaint on file.    HPI  71 y.o. Caucasian male with coronary artery disease, likely nonischemic cardiomyopathy, tobacco dependence, prior history of alcohol abuse.    Patient presents for 35-monthfollow-up of HFrEF and CAD.  Last office visit no changes were made, hold off on reinitiation of spironolactone given hypotension.Since last office visit patient has gained about 15 lbs. He admits to high sugar and carb intake. He also continues to smoke a pack per day. However he does his best to follow low sodium diet.   Patient is without complaints today. Denies chest pain, palpitations, dyspnea, orthopnea, PND, leg edema.   Current Outpatient Medications on File Prior to Visit  Medication Sig Dispense Refill   albuterol (VENTOLIN HFA) 108 (90 Base) MCG/ACT inhaler Inhale 1 puff into the lungs daily as needed for wheezing or shortness of breath.     aspirin 81 MG tablet Take 1 tablet (81 mg total) by mouth daily. 90 tablet 1   atorvastatin (LIPITOR) 40 MG tablet Take 1 tablet (40 mg total) by mouth daily. 90 tablet 3   furosemide (LASIX) 20 MG tablet Take 1 tablet (20 mg total) by mouth daily as needed for fluid or edema. 90 tablet 3   metoprolol succinate (TOPROL-XL) 25 MG 24 hr tablet Take 1 tablet (25 mg total) by mouth daily. 1 tablet 0   omeprazole (PRILOSEC) 20 MG capsule Take 20 mg by mouth daily.     sacubitril-valsartan (ENTRESTO) 24-26 MG Take 1 tablet by mouth 2 (two) times daily.     No current facility-administered medications on file prior to visit.    Cardiovascular and other pertinent studies: EKG 07/24/2021:  Sinus rhythm at a rate of 67 bpm.  Normal axis.  Old inferior infarct.  Poor R wave progression, cannot exclude anteroseptal infarct old.  RHC/LHC 02/20/2021: LM: Normal LAD: Prox and mid medial  calcification with mid focal 30% stenosis Lcx: Small, normal RCA: Mid 100% occlusion         Faint left-to-right collaterals filling diffusely disease PDA and PLA vessels   Normal filling pressures   Well compensated cardiomyopathy, likely nonischemic. Continue GDMT for HFrEF If exertional dyspnea persists in spite of optimal treatment for HFrEF, could consider MRI to assess viability in chronically occluded RCA territory.   Echocardiogram 01/24/2021: Left ventricle cavity is moderately dilated. Normal left ventricular wall thickness. Severe global hypokinesis with basal anteroseptal, anterior akinesis. LVEF 20-25%. Doppler evidence of grade III (restrictive) diastolic dysfunction, elevated LAP. Right ventricle cavity is mildly dilated. Moderately reduced right ventricular function. Left atrial cavity is moderately dilated. Mild to moderate mitral regurgitation. Mild to moderate tricuspid regurgitation. Estimated pulmonary artery systolic pressure 30 mmHg.   EKG 01/20/2021: Probable sinus tachycardia 105 bpm Frequent PACs  Old inferior infarct Lateral nonspecific ST-T changes   Recent labs: CMP Latest Ref Rng & Units 04/11/2021 02/14/2021 02/14/2021  Glucose 70 - 99 mg/dL 98 - -  BUN 8 - 27 mg/dL 19 - -  Creatinine 0.76 - 1.27 mg/dL 0.91 - -  Sodium 134 - 144 mmol/L 139 137 137  Potassium 3.5 - 5.2 mmol/L 4.8 4.9 4.9  Chloride 96 - 106 mmol/L 102 - -  CO2 20 - 29 mmol/L 24 - -  Calcium 8.6 - 10.2 mg/dL 9.3 - -  Total  Protein 6.0 - 8.5 g/dL - - -  Total Bilirubin 0.0 - 1.2 mg/dL - - -  Alkaline Phos 39 - 117 IU/L - - -  AST 0 - 40 IU/L - - -  ALT 0 - 44 IU/L - - -   CBC Latest Ref Rng & Units 02/14/2021 02/14/2021 02/14/2021  WBC 4.0 - 10.5 K/uL - - -  Hemoglobin 13.0 - 17.0 g/dL 16.0 16.0 16.0  Hematocrit 39.0 - 52.0 % 47.0 47.0 47.0  Platelets 150 - 400 K/uL - - -   Lipid Panel     Component Value Date/Time   CHOL 197 10/31/2017 1621   TRIG 121 10/31/2017 1621   HDL 36 (L)  10/31/2017 1621   CHOLHDL 5.5 (H) 10/31/2017 1621   CHOLHDL 5.3 (H) 04/09/2016 1724   VLDL 42 (H) 04/09/2016 1724   LDLCALC 137 (H) 10/31/2017 1621   HEMOGLOBIN A1C Lab Results  Component Value Date   HGBA1C  01/20/2009    5.3 (NOTE) The ADA recommends the following therapeutic goal for glycemic control related to Hgb A1c measurement: Goal of therapy: <6.5 Hgb A1c  Reference: American Diabetes Association: Clinical Practice Recommendations 2010, Diabetes Care, 2010, 33: (Suppl  1).   MPG 105 01/20/2009   TSH No results for input(s): TSH in the last 8760 hours.  04/11/2021: Glucose 98, BUN 19, creatinine 0.91, GFR >60, sodium 139, potassium 4.8  01/30/2021: Glucose 108, BUN/Cr 15/1.07. EGFR 76. Na/K 134/4.5.  NT-Pro BNP 8645 (0-376)  01/19/2021: Glucose 87, BUN/Cr 21/1.16. EGFR ?. Na/K 134/4.5. Rest of the CMP normal H/H 15/44. MCV 102. Platelets 160 HbA1C N/A Chol 128, TG 109, HDL 31, LDL 78 TSH N/A BNP 3394 (<100) Trop I 54 (<47)    Review of Systems  Cardiovascular:  Negative for chest pain, claudication, dyspnea on exertion, leg swelling, near-syncope, orthopnea, palpitations, paroxysmal nocturnal dyspnea and syncope.  Respiratory:  Negative for shortness of breath.   Neurological:  Negative for dizziness and light-headedness.        There were no vitals filed for this visit.   There is no height or weight on file to calculate BMI. There were no vitals filed for this visit.     No data found.   Objective:   Physical Exam Vitals reviewed.  Constitutional:      General: He is not in acute distress. Neck:     Vascular: No JVD.  Cardiovascular:     Rate and Rhythm: Normal rate and regular rhythm.     Heart sounds: Normal heart sounds. No murmur heard. Pulmonary:     Effort: Pulmonary effort is normal.     Breath sounds: Normal breath sounds. No wheezing or rales.  Musculoskeletal:     Right lower leg: No edema.     Left lower leg: No edema.  Physical  exam unchanged compared to previous office visit.        Assessment & Recommendations:   71 y.o. Caucasian male with coronary artery disease, likely nonischemic cardiomyopathy, tobacco dependence, prior history of alcohol abuse.    Chronic systolic heart failure:  Likely nonischemic cardiomyopathy, with RCA CTO, nonobstructive disease LAD Severe biventricular failure, LVEF 25-30% Continue metoprolol, Entresto Blood pressure is now mildly elevated, likely due to weight gain.  Will add spironolactone 12.5 mg daily. Repeat BMP and pro-BNP in 1 week.  Will uptitrate guideline directed medical therapy as able in the future.  He has now been on medical therapy for >3 months, will repeat echocardiogram to  reevaluate LV function.   CAD: RCA CTO - nonobstructive  Continue Aspirin, statin, metoprolol succinate  Follow-up in 3 months, sooner if needed, for HFrEF and CAD.     Alethia Berthold, PA-C 07/24/2021, 1:40 PM Office: [phone]

## 2021-08-03 ENCOUNTER — Telehealth: Payer: Self-pay

## 2021-08-03 NOTE — Telephone Encounter (Signed)
Recently added by CC on 1/.9. Okay to hold for now. CC, should we see him for f/u soon? You have a better idea of his recent HF status.  Thanks MJP

## 2021-08-03 NOTE — Telephone Encounter (Signed)
Agree with holding spironolactone. He was doing well at last visit. As long as symptoms improve holding spironolactone we can keep April follow up, if not I can see him sooner.

## 2021-08-04 NOTE — Telephone Encounter (Signed)
Called and spoke with patient regarding her concerns with medication (Spironolactone). She will make sure to let patient know to continue to HOLD until next office visit in April with CC.

## 2021-10-23 ENCOUNTER — Ambulatory Visit: Payer: Medicare (Managed Care)

## 2021-10-23 ENCOUNTER — Other Ambulatory Visit: Payer: Self-pay

## 2021-10-23 ENCOUNTER — Telehealth: Payer: Self-pay | Admitting: Student

## 2021-10-23 DIAGNOSIS — I5020 Unspecified systolic (congestive) heart failure: Secondary | ICD-10-CM

## 2021-10-23 MED ORDER — ENTRESTO 24-26 MG PO TABS: 1.0000 | ORAL_TABLET | Freq: Two times a day (BID) | ORAL | 0 refills | Status: DC

## 2021-10-23 NOTE — Telephone Encounter (Signed)
Samples placed up front, and re-faced patient assistance paper work.

## 2021-10-23 NOTE — Telephone Encounter (Signed)
Patient's wife calling on behalf of patient, says she spoke with you last week in regards to patient's entresto with [employer]. They still have not received a prescription for that medication, and he is running low. He has an appointment today for an echo, they would like to pick up some samples and speak with you whether you call them back or speak to them in office today. ?

## 2021-10-24 ENCOUNTER — Other Ambulatory Visit: Payer: Self-pay

## 2021-10-24 MED ORDER — ENTRESTO 24-26 MG PO TABS: 1.0000 | ORAL_TABLET | Freq: Two times a day (BID) | ORAL | 3 refills | Status: DC

## 2021-11-01 NOTE — Progress Notes (Signed)
? ? ?Patient referred by [name], MD for elevated troponin ? ?Subjective:  ? ?Logan Green, male    DOB: 03-21-1951, 71 y.o.   MRN: [id number] ? ? ?Chief Complaint  ?Patient presents with  ? Congestive Heart Failure  ? Follow-up  ?  3 month  ? ? ? ?HPI ? ?71 y.o. Caucasian male with coronary artery disease, likely nonischemic cardiomyopathy, tobacco dependence, prior history of alcohol abuse.   ? ?Patient presents for 38-monthfollow-up.  At last office visit added spironolactone 12.5 mg daily, however he called the office after starting this with concerns of gait and balance and fatigue as well as soft blood pressure, patient was therefore advised to discontinue this.  Since last office visit patient has undergone repeat echocardiogram which revealed normalization of LVEF from 20-25% in 01/2021 to 50-55% with grade 2 diastolic dysfunction. ? ?Patient reports he feels the best he has in quite a while.  He unfortunately continues to struggle to make diet and lifestyle modifications.  He was unable to tolerate spironolactone 12.5 mg due to dizziness and fatigue.  However overall he states since last office visit energy level has significantly improved and he is no longer limited by dyspnea on exertion.  Patient [place] on approximately [place] acres and takes care of lots of animals without issue.  He does unfortunately continue to smoke. ? ?Denies chest pain, palpitations, dyspnea, orthopnea, PND, leg edema.  ? ?Current Outpatient Medications on File Prior to Visit  ?Medication Sig Dispense Refill  ? albuterol (VENTOLIN HFA) 108 (90 Base) MCG/ACT inhaler Inhale 1 puff into the lungs daily as needed for wheezing or shortness of breath.    ? aspirin 81 MG tablet Take 1 tablet (81 mg total) by mouth daily. 90 tablet 1  ? atorvastatin (LIPITOR) 40 MG tablet Take 1 tablet (40 mg total) by mouth daily. 90 tablet 3  ? fluticasone (FLONASE) 50 MCG/ACT nasal spray Place into both nostrils daily.    ? metoprolol succinate  (TOPROL-XL) 25 MG 24 hr tablet Take 1 tablet (25 mg total) by mouth daily. 1 tablet 0  ? omeprazole (PRILOSEC) 20 MG capsule Take 20 mg by mouth daily.    ? sacubitril-valsartan (ENTRESTO) 24-26 MG Take 1 tablet by mouth 2 (two) times daily. 180 tablet 3  ? citalopram (CELEXA) 10 MG tablet Take 10 mg by mouth daily.    ? furosemide (LASIX) 20 MG tablet Take 1 tablet (20 mg total) by mouth daily as needed for fluid or edema. 90 tablet 3  ? hydrOXYzine (ATARAX) 25 MG tablet Take 25 mg by mouth at bedtime.    ? ?No current facility-administered medications on file prior to visit.  ? ? ?Cardiovascular and other pertinent studies: ? ?PCV ECHOCARDIOGRAM COMPLETE 10/23/2021 ?Left ventricle cavity is normal in size and wall thickness. Normal global wall motion. Normal LV systolic function with visual EF 50-55%. Doppler evidence of grade II (pseudonormal) diastolic dysfunction, elevated LAP. ?Left atrial cavity is mildly dilated. ?Trileaflet aortic valve. Mild aortic valve leaflet thickening. Trace aortic valve stenosis. ?Mild calcification of the mitral valve annulus. Mild to moderate mitral regurgitation. ?Mild tricuspid regurgitation. ?Mild pulmonic regurgitation. ?No evidence of pulmonary hypertension. ?Previous study on 01/24/2021 reported LVEF 20-25%, mod LA dilatation ? ?EKG 07/24/2021:  ?Sinus rhythm at a rate of 67 bpm.  Normal axis.  Old inferior infarct.  Poor R wave progression, cannot exclude anteroseptal infarct old. ? ?RHC/LHC 02/20/2021: ?LM: Normal ?LAD: Prox and mid medial calcification with mid focal  30% stenosis ?Lcx: Small, normal ?RCA: Mid 100% occlusion ?        Faint left-to-right collaterals filling diffusely disease PDA and PLA vessels ?  ?Normal filling pressures ?  ?Well compensated cardiomyopathy, likely nonischemic. ?Continue GDMT for HFrEF ?If exertional dyspnea persists in spite of optimal treatment for HFrEF, could consider MRI to assess viability in chronically occluded RCA territory. ?   ?Echocardiogram 01/24/2021: ?Left ventricle cavity is moderately dilated. Normal left ventricular wall thickness. Severe global hypokinesis with basal anteroseptal, anterior akinesis. LVEF 20-25%. Doppler evidence of grade III (restrictive) diastolic dysfunction, elevated LAP. ?Right ventricle cavity is mildly dilated. ?Moderately reduced right ventricular function. ?Left atrial cavity is moderately dilated. ?Mild to moderate mitral regurgitation. ?Mild to moderate tricuspid regurgitation. Estimated pulmonary artery systolic pressure 30 mmHg.  ? ?EKG 01/20/2021: ?Probable sinus tachycardia 105 bpm ?Frequent PACs  ?Old inferior infarct ?Lateral nonspecific ST-T changes ? ? ?Recent labs: ? ?  Latest Ref Rng & Units 04/11/2021  ? 10:34 AM 02/14/2021  ?  3:19 PM 02/14/2021  ?  3:15 PM  ?CMP  ?Glucose 70 - 99 mg/dL 98      ?BUN 8 - 27 mg/dL 19      ?Creatinine 0.76 - 1.27 mg/dL 0.91      ?Sodium 134 - 144 mmol/L 139   137   137    ?Potassium 3.5 - 5.2 mmol/L 4.8   4.9   4.9    ?Chloride 96 - 106 mmol/L 102      ?CO2 20 - 29 mmol/L 24      ?Calcium 8.6 - 10.2 mg/dL 9.3      ? ? ?  Latest Ref Rng & Units 02/14/2021  ?  3:19 PM 02/14/2021  ?  3:15 PM 02/14/2021  ?  3:14 PM  ?CBC  ?Hemoglobin 13.0 - 17.0 g/dL 16.0   16.0   16.0    ?Hematocrit 39.0 - 52.0 % 47.0   47.0   47.0    ? ?Lipid Panel  ?   ?Component Value Date/Time  ? CHOL 197 10/31/2017 1621  ? TRIG 121 10/31/2017 1621  ? HDL 36 (L) 10/31/2017 1621  ? CHOLHDL 5.5 (H) 10/31/2017 1621  ? CHOLHDL 5.3 (H) 04/09/2016 1724  ? VLDL 42 (H) 04/09/2016 1724  ? LDLCALC 137 (H) 10/31/2017 1621  ? ?HEMOGLOBIN A1C ?Lab Results  ?Component Value Date  ? HGBA1C  01/20/2009  ?  5.3 ?(NOTE) The ADA recommends the following therapeutic goal for glycemic control related to Hgb A1c measurement: Goal of therapy: <6.5 Hgb A1c  Reference: American Diabetes Association: Clinical Practice Recommendations 2010, Diabetes Care, 2010, 33: (Suppl ? 1).  ? MPG 105 01/20/2009  ? ?TSH ?No results for input(s):  TSH in the last 8760 hours. ? ?04/11/2021: ?Glucose 98, BUN 19, creatinine 0.91, GFR >60, sodium 139, potassium 4.8 ? ?01/30/2021: ?Glucose 108, BUN/Cr 15/1.07. EGFR 76. Na/K 134/4.5.  ?NT-Pro BNP 8645 (0-376) ? ?01/19/2021: ?Glucose 87, BUN/Cr 21/1.16. EGFR ?. Na/K 134/4.5. Rest of the CMP normal ?H/H 15/44. MCV 102. Platelets 160 ?HbA1C N/A ?Chol 128, TG 109, HDL 31, LDL 78 ?TSH N/A ?BNP 3394 (<100) ?Trop I 54 (<47) ? ? ? ?Review of Systems  ?Cardiovascular:  Negative for chest pain, claudication, dyspnea on exertion, leg swelling, near-syncope, orthopnea, palpitations, paroxysmal nocturnal dyspnea and syncope.  ?Respiratory:  Negative for shortness of breath.   ?Neurological:  Negative for dizziness and light-headedness.  ? ?   ? ? ?Vitals:  ? 11/02/21 1306  ?  BP: 134/70  ?Pulse: 60  ?Resp: 16  ?Temp: 98 ?F (36.7 ?C)  ?SpO2: 97%  ? ? ?Body mass index is 24.33 kg/m?. Danley Danker Weights  ? 11/02/21 1306  ?Weight: 160 lb (72.6 kg)  ? ? ?  ?Orthostatic VS for the past 72 hrs (Last 3 readings): ? Patient Position BP Location Cuff Size  ?11/02/21 1306 Sitting Left Arm Normal  ? ? ?Objective:  ? Physical Exam ?Vitals reviewed.  ?Constitutional:   ?   General: He is not in acute distress. ?Neck:  ?   Vascular: No JVD.  ?Cardiovascular:  ?   Rate and Rhythm: Normal rate and regular rhythm.  ?   Heart sounds: Normal heart sounds. No murmur heard. ?Pulmonary:  ?   Effort: Pulmonary effort is normal.  ?   Breath sounds: Normal breath sounds. No wheezing or rales.  ?Musculoskeletal:  ?   Right lower leg: No edema.  ?   Left lower leg: No edema.  ?Physical exam unchanged compared to previous office visit.  ?  ?   ? ?Assessment & Recommendations:  ? ?71 y.o. Caucasian male with coronary artery disease, likely nonischemic cardiomyopathy, tobacco dependence, prior history of alcohol abuse.   ? ?Chronic systolic heart failure:  ?Likely nonischemic cardiomyopathy, with RCA CTO, nonobstructive disease LAD ?Severe biventricular failure, LVEF  25-30% in 01/2021 ?LVEF has normalized to 50-55% on recent echocardiogram 10/2021 ?Continue metoprolol and Entresto ?Given normalization of LVEF we will hold off on further up titration of guideline therapy.

## 2021-11-02 ENCOUNTER — Ambulatory Visit: Payer: Medicare (Managed Care) | Admitting: Student

## 2021-11-02 ENCOUNTER — Encounter: Payer: Self-pay | Admitting: Student

## 2021-11-02 VITALS — BP 134/70 | HR 60 | Temp 98.0°F | Resp 16 | Ht 68.0 in | Wt 160.0 lb

## 2021-11-02 DIAGNOSIS — I5020 Unspecified systolic (congestive) heart failure: Secondary | ICD-10-CM

## 2021-11-02 DIAGNOSIS — I2510 Atherosclerotic heart disease of native coronary artery without angina pectoris: Secondary | ICD-10-CM

## 2021-11-03 ENCOUNTER — Other Ambulatory Visit: Payer: Self-pay

## 2021-11-03 MED ORDER — ENTRESTO 24-26 MG PO TABS: 1.0000 | ORAL_TABLET | Freq: Two times a day (BID) | ORAL | 3 refills | Status: DC

## 2021-11-06 ENCOUNTER — Other Ambulatory Visit: Payer: Self-pay

## 2021-11-06 MED ORDER — ENTRESTO 24-26 MG PO TABS: 1.0000 | ORAL_TABLET | Freq: Two times a day (BID) | ORAL | 3 refills | Status: DC

## 2022-02-01 ENCOUNTER — Inpatient Hospital Stay (HOSPITAL_COMMUNITY)
Admission: EM | Admit: 2022-02-01 | Discharge: 2022-02-02 | DRG: 982 | Disposition: A | Discharge status: Home / Self Care | Payer: Medicare (Managed Care) | Source: Ambulatory Visit | Attending: Family Medicine | Admitting: Family Medicine

## 2022-02-01 ENCOUNTER — Encounter (HOSPITAL_COMMUNITY): Payer: Self-pay | Admitting: Emergency Medicine

## 2022-02-01 ENCOUNTER — Emergency Department (HOSPITAL_COMMUNITY): Payer: Medicare (Managed Care)

## 2022-02-01 ENCOUNTER — Inpatient Hospital Stay (HOSPITAL_COMMUNITY): Payer: Medicare (Managed Care) | Admitting: Certified Registered"

## 2022-02-01 ENCOUNTER — Encounter (HOSPITAL_COMMUNITY)
Admission: EM | Disposition: A | Discharge status: Home / Self Care | Payer: Self-pay | Source: Ambulatory Visit | Attending: Internal Medicine

## 2022-02-01 ENCOUNTER — Other Ambulatory Visit: Payer: Self-pay

## 2022-02-01 DIAGNOSIS — F32A Depression, unspecified: Secondary | ICD-10-CM | POA: Diagnosis present

## 2022-02-01 DIAGNOSIS — S61052A Open bite of left thumb without damage to nail, initial encounter: Secondary | ICD-10-CM

## 2022-02-01 DIAGNOSIS — I25118 Atherosclerotic heart disease of native coronary artery with other forms of angina pectoris: Secondary | ICD-10-CM | POA: Diagnosis present

## 2022-02-01 DIAGNOSIS — Z7982 Long term (current) use of aspirin: Secondary | ICD-10-CM | POA: Diagnosis not present

## 2022-02-01 DIAGNOSIS — I10 Essential (primary) hypertension: Secondary | ICD-10-CM | POA: Diagnosis present

## 2022-02-01 DIAGNOSIS — I2510 Atherosclerotic heart disease of native coronary artery without angina pectoris: Secondary | ICD-10-CM | POA: Diagnosis present

## 2022-02-01 DIAGNOSIS — L089 Local infection of the skin and subcutaneous tissue, unspecified: Secondary | ICD-10-CM | POA: Diagnosis not present

## 2022-02-01 DIAGNOSIS — I110 Hypertensive heart disease with heart failure: Secondary | ICD-10-CM | POA: Diagnosis present

## 2022-02-01 DIAGNOSIS — L03012 Cellulitis of left finger: Secondary | ICD-10-CM | POA: Diagnosis present

## 2022-02-01 DIAGNOSIS — I5022 Chronic systolic (congestive) heart failure: Secondary | ICD-10-CM | POA: Diagnosis present

## 2022-02-01 DIAGNOSIS — J449 Chronic obstructive pulmonary disease, unspecified: Secondary | ICD-10-CM | POA: Diagnosis present

## 2022-02-01 DIAGNOSIS — L02512 Cutaneous abscess of left hand: Secondary | ICD-10-CM | POA: Diagnosis present

## 2022-02-01 DIAGNOSIS — Z720 Tobacco use: Secondary | ICD-10-CM

## 2022-02-01 DIAGNOSIS — Z79899 Other long term (current) drug therapy: Secondary | ICD-10-CM

## 2022-02-01 DIAGNOSIS — F419 Anxiety disorder, unspecified: Secondary | ICD-10-CM | POA: Diagnosis present

## 2022-02-01 DIAGNOSIS — F17210 Nicotine dependence, cigarettes, uncomplicated: Secondary | ICD-10-CM | POA: Diagnosis present

## 2022-02-01 DIAGNOSIS — Z8249 Family history of ischemic heart disease and other diseases of the circulatory system: Secondary | ICD-10-CM

## 2022-02-01 DIAGNOSIS — Z23 Encounter for immunization: Secondary | ICD-10-CM | POA: Diagnosis present

## 2022-02-01 DIAGNOSIS — W5501XA Bitten by cat, initial encounter: Secondary | ICD-10-CM | POA: Diagnosis not present

## 2022-02-01 DIAGNOSIS — I5020 Unspecified systolic (congestive) heart failure: Secondary | ICD-10-CM | POA: Diagnosis present

## 2022-02-01 HISTORY — DX: Chronic obstructive pulmonary disease, unspecified: J44.9

## 2022-02-01 HISTORY — PX: I & D EXTREMITY: SHX5045

## 2022-02-01 HISTORY — DX: Pneumonia, unspecified organism: J18.9

## 2022-02-01 LAB — CBC WITH DIFFERENTIAL/PLATELET
Abs Immature Granulocytes: 0.04 10*3/uL (ref 0.00–0.07)
Basophils Absolute: 0.1 10*3/uL (ref 0.0–0.1)
Basophils Relative: 1 %
Eosinophils Absolute: 0.4 10*3/uL (ref 0.0–0.5)
Eosinophils Relative: 3 %
HCT: 41.6 % (ref 39.0–52.0)
Hemoglobin: 14.1 g/dL (ref 13.0–17.0)
Immature Granulocytes: 0 %
Lymphocytes Relative: 22 %
Lymphs Abs: 2.5 10*3/uL (ref 0.7–4.0)
MCH: 32.1 pg (ref 26.0–34.0)
MCHC: 33.9 g/dL (ref 30.0–36.0)
MCV: 94.8 fL (ref 80.0–100.0)
Monocytes Absolute: 1.2 10*3/uL — ABNORMAL HIGH (ref 0.1–1.0)
Monocytes Relative: 10 %
Neutro Abs: 7.2 10*3/uL (ref 1.7–7.7)
Neutrophils Relative %: 64 %
Platelets: 294 10*3/uL (ref 150–400)
RBC: 4.39 MIL/uL (ref 4.22–5.81)
RDW: 11.5 % (ref 11.5–15.5)
WBC: 11.3 10*3/uL — ABNORMAL HIGH (ref 4.0–10.5)
nRBC: 0 % (ref 0.0–0.2)

## 2022-02-01 LAB — BASIC METABOLIC PANEL
Anion gap: 9 (ref 5–15)
BUN: 28 mg/dL — ABNORMAL HIGH (ref 8–23)
CO2: 24 mmol/L (ref 22–32)
Calcium: 9.2 mg/dL (ref 8.9–10.3)
Chloride: 101 mmol/L (ref 98–111)
Creatinine, Ser: 1.09 mg/dL (ref 0.61–1.24)
GFR, Estimated: >60 mL/min (ref >60)
Glucose, Bld: 113 mg/dL — ABNORMAL HIGH (ref 70–99)
Potassium: 4.5 mmol/L (ref 3.5–5.1)
Sodium: 134 mmol/L — ABNORMAL LOW (ref 135–145)

## 2022-02-01 LAB — SURGICAL PCR SCREEN
MRSA, PCR: NEGATIVE
Staphylococcus aureus: NEGATIVE

## 2022-02-01 LAB — SEDIMENTATION RATE: Sed Rate: 42 mm/hr — ABNORMAL HIGH (ref 0–16)

## 2022-02-01 LAB — C-REACTIVE PROTEIN: CRP: 4 mg/dL — ABNORMAL HIGH (ref <1.0)

## 2022-02-01 SURGERY — IRRIGATION AND DEBRIDEMENT EXTREMITY
Anesthesia: General | Laterality: Left

## 2022-02-01 MED ORDER — ONDANSETRON HCL 4 MG/2ML IJ SOLN: 4.0000 mg | Freq: Four times a day (QID) | INTRAMUSCULAR | Status: DC | PRN

## 2022-02-01 MED ORDER — LIDOCAINE 2% (20 MG/ML) 5 ML SYRINGE
INTRAMUSCULAR | Status: DC | PRN
  Administered 2022-02-01: 60 mg via INTRAVENOUS

## 2022-02-01 MED ORDER — ONDANSETRON HCL 4 MG/2ML IJ SOLN
INTRAMUSCULAR | Status: DC | PRN
  Administered 2022-02-01: 4 mg via INTRAVENOUS

## 2022-02-01 MED ORDER — ONDANSETRON HCL 4 MG PO TABS: 4.0000 mg | ORAL_TABLET | Freq: Four times a day (QID) | ORAL | Status: DC | PRN

## 2022-02-01 MED ORDER — SODIUM CHLORIDE 0.9 % IV SOLN
1000.0000 mL | Freq: Once | INTRAVENOUS | Status: AC
  Administered 2022-02-01: 1000 mL via INTRAVENOUS

## 2022-02-01 MED ORDER — ONDANSETRON HCL 4 MG/2ML IJ SOLN
INTRAMUSCULAR | Status: AC
Start: 2022-02-01 — End: ?
  Filled 2022-02-01: qty 2

## 2022-02-01 MED ORDER — ACETAMINOPHEN 10 MG/ML IV SOLN
INTRAVENOUS | Status: AC
  Filled 2022-02-01: qty 100

## 2022-02-01 MED ORDER — FENTANYL CITRATE (PF) 100 MCG/2ML IJ SOLN
INTRAMUSCULAR | Status: AC
  Filled 2022-02-01: qty 2

## 2022-02-01 MED ORDER — VANCOMYCIN HCL 1750 MG/350ML IV SOLN
1750.0000 mg | INTRAVENOUS | Status: DC
  Administered 2022-02-02: 1750 mg via INTRAVENOUS
  Filled 2022-02-01: qty 350

## 2022-02-01 MED ORDER — CHLORHEXIDINE GLUCONATE 4 % EX LIQD: 60.0000 mL | Freq: Once | CUTANEOUS | Status: DC

## 2022-02-01 MED ORDER — ACETAMINOPHEN 10 MG/ML IV SOLN
INTRAVENOUS | Status: DC | PRN
  Administered 2022-02-01: 1000 mg via INTRAVENOUS

## 2022-02-01 MED ORDER — BUPIVACAINE HCL (PF) 0.25 % IJ SOLN
INTRAMUSCULAR | Status: AC
  Filled 2022-02-01: qty 30

## 2022-02-01 MED ORDER — PROPOFOL 10 MG/ML IV BOLUS
INTRAVENOUS | Status: DC | PRN
  Administered 2022-02-01: 200 mg via INTRAVENOUS

## 2022-02-01 MED ORDER — ASCORBIC ACID 500 MG PO TABS
1000.0000 mg | ORAL_TABLET | Freq: Every day | ORAL | Status: DC
  Administered 2022-02-02: 1000 mg via ORAL
  Filled 2022-02-01: qty 2

## 2022-02-01 MED ORDER — FENTANYL CITRATE (PF) 100 MCG/2ML IJ SOLN
25.0000 ug | INTRAMUSCULAR | Status: DC | PRN
  Administered 2022-02-01: 50 ug via INTRAVENOUS

## 2022-02-01 MED ORDER — PIPERACILLIN-TAZOBACTAM 3.375 G IVPB 30 MIN
3.3750 g | Freq: Once | INTRAVENOUS | Status: AC
  Administered 2022-02-01: 3.375 g via INTRAVENOUS
  Filled 2022-02-01: qty 50

## 2022-02-01 MED ORDER — RABIES IMMUNE GLOBULIN 150 UNIT/ML IM INJ
20.0000 [IU]/kg | INJECTION | Freq: Once | INTRAMUSCULAR | Status: DC
  Filled 2022-02-01: qty 20

## 2022-02-01 MED ORDER — PHENYLEPHRINE 80 MCG/ML (10ML) SYRINGE FOR IV PUSH (FOR BLOOD PRESSURE SUPPORT)
PREFILLED_SYRINGE | INTRAVENOUS | Status: DC | PRN
  Administered 2022-02-01 (×2): 80 ug via INTRAVENOUS

## 2022-02-01 MED ORDER — ROCURONIUM BROMIDE 10 MG/ML (PF) SYRINGE
PREFILLED_SYRINGE | INTRAVENOUS | Status: AC
Start: 2022-02-01 — End: ?
  Filled 2022-02-01: qty 50

## 2022-02-01 MED ORDER — RABIES IMMUNE GLOBULIN 150 UNIT/ML IM INJ
1500.0000 [IU] | INJECTION | Freq: Once | INTRAMUSCULAR | Status: AC
  Administered 2022-02-01: 1500 [IU]
  Filled 2022-02-01: qty 10

## 2022-02-01 MED ORDER — NICOTINE 14 MG/24HR TD PT24
14.0000 mg | MEDICATED_PATCH | Freq: Every day | TRANSDERMAL | Status: DC
Start: 2022-02-01 — End: 2022-02-02
  Administered 2022-02-01 – 2022-02-02 (×2): 14 mg via TRANSDERMAL
  Filled 2022-02-01 (×2): qty 1

## 2022-02-01 MED ORDER — MIDAZOLAM HCL 2 MG/2ML IJ SOLN
INTRAMUSCULAR | Status: AC
  Filled 2022-02-01: qty 2

## 2022-02-01 MED ORDER — ORAL CARE MOUTH RINSE: 15.0000 mL | Freq: Once | OROMUCOSAL | Status: AC

## 2022-02-01 MED ORDER — PHENYLEPHRINE 80 MCG/ML (10ML) SYRINGE FOR IV PUSH (FOR BLOOD PRESSURE SUPPORT)
PREFILLED_SYRINGE | INTRAVENOUS | Status: AC
Start: 2022-02-01 — End: ?
  Filled 2022-02-01: qty 20

## 2022-02-01 MED ORDER — VANCOMYCIN HCL 1750 MG/350ML IV SOLN
1750.0000 mg | Freq: Once | INTRAVENOUS | Status: AC
  Administered 2022-02-01: 1750 mg via INTRAVENOUS
  Filled 2022-02-01: qty 350

## 2022-02-01 MED ORDER — LACTATED RINGERS IV SOLN: INTRAVENOUS | Status: DC

## 2022-02-01 MED ORDER — EPHEDRINE 5 MG/ML INJ
INTRAVENOUS | Status: AC
  Filled 2022-02-01: qty 5

## 2022-02-01 MED ORDER — MIDAZOLAM HCL 2 MG/2ML IJ SOLN
INTRAMUSCULAR | Status: DC | PRN
  Administered 2022-02-01: 2 mg via INTRAVENOUS

## 2022-02-01 MED ORDER — CHLORHEXIDINE GLUCONATE 0.12 % MT SOLN
15.0000 mL | Freq: Once | OROMUCOSAL | Status: AC
  Administered 2022-02-01: 15 mL via OROMUCOSAL

## 2022-02-01 MED ORDER — HYDRALAZINE HCL 20 MG/ML IJ SOLN: 10.0000 mg | Freq: Three times a day (TID) | INTRAMUSCULAR | Status: DC | PRN

## 2022-02-01 MED ORDER — POVIDONE-IODINE 10 % EX SWAB
2.0000 | Freq: Once | CUTANEOUS | Status: AC
Start: 2022-02-01 — End: 2022-02-01
  Administered 2022-02-01: 2 via TOPICAL

## 2022-02-01 MED ORDER — FENTANYL CITRATE (PF) 250 MCG/5ML IJ SOLN
INTRAMUSCULAR | Status: DC | PRN
  Administered 2022-02-01: 50 ug via INTRAVENOUS

## 2022-02-01 MED ORDER — RABIES IMMUNE GLOBULIN 150 UNIT/ML IM INJ
150.0000 [IU] | INJECTION | Freq: Once | INTRAMUSCULAR | Status: AC
  Administered 2022-02-01: 150 [IU]
  Filled 2022-02-01: qty 2

## 2022-02-01 MED ORDER — FENTANYL CITRATE (PF) 250 MCG/5ML IJ SOLN
INTRAMUSCULAR | Status: AC
  Filled 2022-02-01: qty 5

## 2022-02-01 MED ORDER — 0.9 % SODIUM CHLORIDE (POUR BTL) OPTIME
TOPICAL | Status: DC | PRN
  Administered 2022-02-01: 1000 mL

## 2022-02-01 MED ORDER — LIDOCAINE 2% (20 MG/ML) 5 ML SYRINGE
INTRAMUSCULAR | Status: AC
Start: 2022-02-01 — End: ?
  Filled 2022-02-01: qty 5

## 2022-02-01 MED ORDER — SODIUM CHLORIDE 0.9 % IV SOLN
3.0000 g | Freq: Four times a day (QID) | INTRAVENOUS | Status: DC
Start: 2022-02-01 — End: 2022-02-02
  Administered 2022-02-02 (×3): 3 g via INTRAVENOUS
  Filled 2022-02-01 (×3): qty 8

## 2022-02-01 MED ORDER — PROPOFOL 10 MG/ML IV BOLUS
INTRAVENOUS | Status: AC
Start: 2022-02-01 — End: ?
  Filled 2022-02-01: qty 20

## 2022-02-01 MED ORDER — DEXAMETHASONE SODIUM PHOSPHATE 10 MG/ML IJ SOLN
INTRAMUSCULAR | Status: DC | PRN
  Administered 2022-02-01: 10 mg via INTRAVENOUS

## 2022-02-01 MED ORDER — BUPIVACAINE HCL (PF) 0.25 % IJ SOLN
INTRAMUSCULAR | Status: DC | PRN
  Administered 2022-02-01: 9 mL

## 2022-02-01 MED ORDER — MORPHINE SULFATE (PF) 2 MG/ML IV SOLN
2.0000 mg | INTRAVENOUS | Status: DC | PRN
  Administered 2022-02-01 – 2022-02-02 (×4): 2 mg via INTRAVENOUS
  Filled 2022-02-01 (×4): qty 1

## 2022-02-01 MED ORDER — DEXAMETHASONE SODIUM PHOSPHATE 10 MG/ML IJ SOLN
INTRAMUSCULAR | Status: AC
  Filled 2022-02-01: qty 1

## 2022-02-01 MED ORDER — RABIES VACCINE, PCEC IM SUSR
1.0000 mL | Freq: Once | INTRAMUSCULAR | Status: AC
  Administered 2022-02-01: 1 mL via INTRAMUSCULAR
  Filled 2022-02-01: qty 1

## 2022-02-01 SURGICAL SUPPLY — 64 items
ADAPTER CATH SYR TO TUBING 38M (ADAPTER) ×1 IMPLANT
BAG COUNTER SPONGE SURGICOUNT (BAG) ×2 IMPLANT
BNDG COHESIVE 2X5 TAN STRL LF (GAUZE/BANDAGES/DRESSINGS) IMPLANT
BNDG ELASTIC 3X5.8 VLCR NS LF (GAUZE/BANDAGES/DRESSINGS) ×1 IMPLANT
BNDG ELASTIC 3X5.8 VLCR STR LF (GAUZE/BANDAGES/DRESSINGS) ×2 IMPLANT
BNDG ELASTIC 4X5.8 VLCR STR LF (GAUZE/BANDAGES/DRESSINGS) ×1 IMPLANT
BNDG ESMARK 4X9 LF (GAUZE/BANDAGES/DRESSINGS) ×1 IMPLANT
BNDG GAUZE DERMACEA FLUFF (GAUZE/BANDAGES/DRESSINGS) ×1
BNDG GAUZE DERMACEA FLUFF 4 (GAUZE/BANDAGES/DRESSINGS) IMPLANT
BNDG GAUZE ELAST 4 BULKY (GAUZE/BANDAGES/DRESSINGS) ×2 IMPLANT
CANNULA VESSEL 3MM 2 BLNT TIP (CANNULA) ×1 IMPLANT
CORD BIPOLAR FORCEPS 12FT (ELECTRODE) ×2 IMPLANT
COVER SURGICAL LIGHT HANDLE (MISCELLANEOUS) ×2 IMPLANT
CUFF TOURN SGL QUICK 18X4 (TOURNIQUET CUFF) ×1 IMPLANT
CUFF TOURN SGL QUICK 24 (TOURNIQUET CUFF)
CUFF TRNQT CYL 24X4X16.5-23 (TOURNIQUET CUFF) IMPLANT
DRAIN PENROSE 1/4X12 LTX STRL (WOUND CARE) IMPLANT
DRSG PAD ABDOMINAL 8X10 ST (GAUZE/BANDAGES/DRESSINGS) ×2 IMPLANT
GAUZE PACKING IODOFORM 1/4X15 (PACKING) ×1 IMPLANT
GAUZE SPONGE 4X4 12PLY STRL (GAUZE/BANDAGES/DRESSINGS) ×2 IMPLANT
GAUZE XEROFORM 1X8 LF (GAUZE/BANDAGES/DRESSINGS) ×2 IMPLANT
GLOVE BIO SURGEON STRL SZ7.5 (GLOVE) ×2 IMPLANT
GLOVE BIOGEL PI IND STRL 8 (GLOVE) ×1 IMPLANT
GLOVE BIOGEL PI IND STRL 8.5 (GLOVE) IMPLANT
GLOVE BIOGEL PI INDICATOR 8 (GLOVE) ×1
GLOVE BIOGEL PI INDICATOR 8.5 (GLOVE)
GLOVE BIOGEL PI ORTHO PRO SZ8 (GLOVE)
GLOVE PI ORTHO PRO STRL SZ8 (GLOVE) IMPLANT
GLOVE SURG ORTHO 8.0 STRL STRW (GLOVE) ×1 IMPLANT
GOWN STRL REUS W/ TWL LRG LVL3 (GOWN DISPOSABLE) ×1 IMPLANT
GOWN STRL REUS W/ TWL XL LVL3 (GOWN DISPOSABLE) ×1 IMPLANT
GOWN STRL REUS W/TWL LRG LVL3 (GOWN DISPOSABLE) ×2
GOWN STRL REUS W/TWL XL LVL3 (GOWN DISPOSABLE) ×2
KIT BASIN OR (CUSTOM PROCEDURE TRAY) ×2 IMPLANT
KIT TURNOVER KIT B (KITS) ×2 IMPLANT
LOOP VESSEL MAXI BLUE (MISCELLANEOUS) IMPLANT
MANIFOLD NEPTUNE II (INSTRUMENTS) IMPLANT
NDL HYPO 25X1 1.5 SAFETY (NEEDLE) IMPLANT
NEEDLE HYPO 25X1 1.5 SAFETY (NEEDLE) ×2 IMPLANT
NS IRRIG 1000ML POUR BTL (IV SOLUTION) ×2 IMPLANT
PACK ORTHO EXTREMITY (CUSTOM PROCEDURE TRAY) ×2 IMPLANT
PAD ARMBOARD 7.5X6 YLW CONV (MISCELLANEOUS) ×4 IMPLANT
PAD CAST 3X4 CTTN HI CHSV (CAST SUPPLIES) IMPLANT
PAD CAST 4YDX4 CTTN HI CHSV (CAST SUPPLIES) IMPLANT
PADDING CAST COTTON 3X4 STRL (CAST SUPPLIES) ×2
PADDING CAST COTTON 4X4 STRL (CAST SUPPLIES) ×2
SET CYSTO W/LG BORE CLAMP LF (SET/KITS/TRAYS/PACK) IMPLANT
SOL PREP POV-IOD 4OZ 10% (MISCELLANEOUS) ×4 IMPLANT
SPIKE FLUID TRANSFER (MISCELLANEOUS) ×1 IMPLANT
SPLINT PLASTER EXTRA FAST 3X15 (CAST SUPPLIES) ×1
SPLINT PLASTER GYPS XFAST 3X15 (CAST SUPPLIES) IMPLANT
SPONGE T-LAP 4X18 ~~LOC~~+RFID (SPONGE) ×2 IMPLANT
SUT ETHILON 4 0 P 3 18 (SUTURE) IMPLANT
SUT ETHILON 4 0 PS 2 18 (SUTURE) IMPLANT
SUT MON AB 5-0 P3 18 (SUTURE) IMPLANT
SWAB COLLECTION DEVICE MRSA (MISCELLANEOUS) ×1 IMPLANT
SWAB CULTURE ESWAB REG 1ML (MISCELLANEOUS) ×1 IMPLANT
SYR 20ML LL LF (SYRINGE) ×1 IMPLANT
SYR CONTROL 10ML LL (SYRINGE) ×1 IMPLANT
TOWEL GREEN STERILE (TOWEL DISPOSABLE) ×2 IMPLANT
TUBE CONNECTING 12X1/4 (SUCTIONS) ×2 IMPLANT
TUBE FEEDING ENTERAL 5FR 16IN (TUBING) IMPLANT
UNDERPAD 30X36 HEAVY ABSORB (UNDERPADS AND DIAPERS) ×2 IMPLANT
YANKAUER SUCT BULB TIP NO VENT (SUCTIONS) ×2 IMPLANT

## 2022-02-01 NOTE — ED Provider Notes (Signed)
[facility]-EMERGENCY DEPT Provider Note  CSN: [id number] Arrival date & time: 02/01/22 1045  Chief Complaint(s) Animal Bite  HPI Logan Green is a 71 y.o. male with PMH HTN, CAD, CHF who presents emergency department for evaluation of a cat bite.  Patient states that he was bit by a feral cat approximately 1 week ago.  He has been seen in an urgent care twice and received multiple rounds of IM Rocephin and placed on doxycycline and Augmentin.  Despite this, the patient's left thumb has progressively worsened from an erythema and swelling standpoint and is now actively draining pus.  He was seen in urgent care again today and transferred to the emergency department for IV antibiotics.  He denies fever, chest pain, shortness of breath, Donnell pain, nausea, vomiting or other systemic symptoms.  Patient did not receive rabies vaccines at that time.   Past Medical History Past Medical History:  Diagnosis Date   Hypertension    Rheumatic fever    Patient Active Problem List   Diagnosis Date Noted   Coronary artery disease involving native coronary artery of native heart without angina pectoris 03/06/2021   Cigarette nicotine dependence without complication 03/06/2021   Elevated troponin 01/20/2021   HFrEF (heart failure with reduced ejection fraction) (HCC) 01/20/2021   Acute on chronic systolic heart failure (HCC) 01/20/2021   Essential hypertension 04/11/2016   Home Medication(s) Prior to Admission medications   Medication Sig Start Date End Date Taking? Authorizing Provider  albuterol (VENTOLIN HFA) 108 (90 Base) MCG/ACT inhaler Inhale 1 puff into the lungs daily as needed for wheezing or shortness of breath. 01/20/21   [provider]  aspirin 81 MG tablet Take 1 tablet (81 mg total) by mouth daily. 10/31/17   Wallis Bamberg, PA-C  atorvastatin (LIPITOR) 40 MG tablet Take 1 tablet (40 mg total) by mouth daily. 11/30/17   Wallis Bamberg, PA-C  citalopram  (CELEXA) 10 MG tablet Take 10 mg by mouth daily. 10/05/21   [provider]  fluticasone (FLONASE) 50 MCG/ACT nasal spray Place into both nostrils daily.    [provider]  furosemide (LASIX) 20 MG tablet Take 1 tablet (20 mg total) by mouth daily as needed for fluid or edema. 04/21/21 07/20/21  Cantwell, Celeste C, PA-C  hydrOXYzine (ATARAX) 25 MG tablet Take 25 mg by mouth at bedtime. 10/05/21   [provider]  metoprolol succinate (TOPROL-XL) 25 MG 24 hr tablet Take 1 tablet (25 mg total) by mouth daily. 03/06/21   Patwardhan, Anabel Bene, MD  omeprazole (PRILOSEC) 20 MG capsule Take 20 mg by mouth daily.    [provider]  sacubitril-valsartan (ENTRESTO) 24-26 MG Take 1 tablet by mouth 2 (two) times daily. 11/06/21   Cantwell, Renne Musca, PA-C  Past Surgical History Past Surgical History:  Procedure Laterality Date   RIGHT/LEFT HEART CATH AND CORONARY ANGIOGRAPHY N/A 02/14/2021   Procedure: RIGHT/LEFT HEART CATH AND CORONARY ANGIOGRAPHY;  Surgeon: Nigel Mormon, MD;  Location: [facility];  Service: Cardiovascular;  Laterality: N/A;   Family History Family History  Problem Relation Age of Onset   Heart failure Mother     Social History Social History   Tobacco Use   Smoking status: Every Day    Packs/day: 0.50    Years: 41.00    Total pack years: 20.50    Types: Cigarettes   Smokeless tobacco: Never  Vaping Use   Vaping Use: Never used  Substance Use Topics   Alcohol use: Yes    Comment: occ   Drug use: Yes    Types: Marijuana   Allergies Patient has no known allergies.  Review of Systems Review of Systems  Musculoskeletal:  Positive for arthralgias.  Skin:  Positive for wound.    Physical Exam Vital Signs  I have reviewed the triage vital signs BP (!) 107/55   Pulse 71   Temp 98 F (36.7 C)  (Oral)   Resp 18   Ht 5\' 8"  (1.727 m)   Wt 83.9 kg   SpO2 96%   BMI 28.13 kg/m   Physical Exam Vitals and nursing note reviewed.  Constitutional:      General: He is not in acute distress.    Appearance: He is well-developed.  HENT:     Head: Normocephalic and atraumatic.  Eyes:     Conjunctiva/sclera: Conjunctivae normal.  Cardiovascular:     Rate and Rhythm: Normal rate and regular rhythm.     Heart sounds: No murmur heard. Pulmonary:     Effort: Pulmonary effort is normal. No respiratory distress.     Breath sounds: Normal breath sounds.  Abdominal:     Palpations: Abdomen is soft.     Tenderness: There is no abdominal tenderness.  Musculoskeletal:        General: Swelling, tenderness and deformity present.     Cervical back: Neck supple.  Skin:    General: Skin is warm and dry.     Capillary Refill: Capillary refill takes less than 2 seconds.     Findings: Lesion present.  Neurological:     Mental Status: He is alert.  Psychiatric:        Mood and Affect: Mood normal.        ED Results and Treatments Labs (all labs ordered are listed, but only abnormal results are displayed) Labs Reviewed  BASIC METABOLIC PANEL - Abnormal; Notable for the following components:      Result Value   Sodium 134 (*)    Glucose, Bld 113 (*)    BUN 28 (*)    All other components within normal limits  CBC WITH DIFFERENTIAL/PLATELET - Abnormal; Notable for the following components:   WBC 11.3 (*)    Monocytes Absolute 1.2 (*)    All other components within normal limits  AEROBIC CULTURE W GRAM STAIN (SUPERFICIAL SPECIMEN)  SEDIMENTATION RATE  C-REACTIVE PROTEIN  Radiology DG Hand Complete Left  Result Date: 02/01/2022 CLINICAL DATA:  Left thumb swelling after cat bite. EXAM: LEFT HAND - COMPLETE 3+ VIEW COMPARISON:  None Available. FINDINGS: There is no  evidence of fracture or dislocation. Moderate degenerative changes seen involving the first interphalangeal joint. Mild degenerative changes seen involving the first carpometacarpal joint. Soft tissues are unremarkable. IMPRESSION: Mild-to-moderate degenerative changes as described above. No acute abnormality seen. Electronically Signed   By: Lupita Raider M.D.   On: 02/01/2022 12:34    Pertinent labs & imaging results that were available during my care of the patient were reviewed by me and considered in my medical decision making (see MDM for details).  Medications Ordered in ED Medications  vancomycin (VANCOREADY) IVPB 1750 mg/350 mL (1,750 mg Intravenous New Bag/Given 02/01/22 1302)  piperacillin-tazobactam (ZOSYN) IVPB 3.375 g (0 g Intravenous Stopped 02/01/22 1301)                                                                                                                                     Procedures Procedures  (including critical care time)  Medical Decision Making / ED Course   This patient presents to the ED for concern of cat bite, wound, this involves an extensive number of treatment options, and is a complaint that carries with it a high risk of complications and morbidity.  The differential diagnosis includes cellulitis, abscess, retained foreign body, FTS  MDM: Patient seen emergency room for evaluation of a wound after a cat bite.  Physical exam reveals significant erythema and swelling with active pus draining from the dorsal surface of the left thumb as seen in the pictures above.  Initial laboratory evaluation with an elevated CRP to 4.0, sed rate 42, mild leukocytosis to 11.3 but is otherwise unremarkable.  X-ray with no soft tissue gas.  Patient started on broad-spectrum antibiotics and given that this patient's wound was from a feral cat, he was given both the immunoglobulin and the rabies vaccine.  Orthopedics was consulted who came and evaluated the patient at  bedside and states that he will need a hospital admission and possible OR surgical washout.  Patient then admitted to the medicine service.   Additional history obtained: -Additional history obtained from wife -External records from outside source obtained and reviewed including: Chart review including previous notes, labs, imaging, consultation notes   Lab Tests: -I ordered, reviewed, and interpreted labs.   The pertinent results include:   Labs Reviewed  BASIC METABOLIC PANEL - Abnormal; Notable for the following components:      Result Value   Sodium 134 (*)    Glucose, Bld 113 (*)    BUN 28 (*)    All other components within normal limits  CBC WITH DIFFERENTIAL/PLATELET - Abnormal; Notable for the following components:   WBC 11.3 (*)    Monocytes Absolute 1.2 (*)    All other components within normal  limits  AEROBIC CULTURE W GRAM STAIN (SUPERFICIAL SPECIMEN)  SEDIMENTATION RATE  C-REACTIVE PROTEIN      Imaging Studies ordered: I ordered imaging studies including hand x-ray I independently visualized and interpreted imaging. I agree with the radiologist interpretation   Medicines ordered and prescription drug management: Meds ordered this encounter  Medications   piperacillin-tazobactam (ZOSYN) IVPB 3.375 g    Order Specific Question:   Antibiotic Indication:    Answer:   Wound Infection   vancomycin (VANCOREADY) IVPB 1750 mg/350 mL    Order Specific Question:   Indication:    Answer:   Osteomyelitis    -I have reviewed the patients home medicines and have made adjustments as needed  Critical interventions none  Consultations Obtained: I requested consultation with the orthopedic surgeon PA Hilbert Odor,  and discussed lab and imaging findings as well as pertinent plan - they recommend: Hospital admission and possible or washout   Cardiac Monitoring: The patient was maintained on a cardiac monitor.  I personally viewed and interpreted the cardiac monitored  which showed an underlying rhythm of: NSR  Social Determinants of Health:  Factors impacting patients care include: none   Reevaluation: After the interventions noted above, I reevaluated the patient and found that they have :improved  Co morbidities that complicate the patient evaluation  Past Medical History:  Diagnosis Date   Hypertension    Rheumatic fever       Dispostion: I considered admission for this patient, and given cellulitis failing multiple outpatient antibiotic regimens and need for possible OR surgical washout, patient will be admitted.     Final Clinical Impression(s) / ED Diagnoses Final diagnoses:  None     @PCDICTATION @    Teressa Lower, MD 02/02/22 1024

## 2022-02-01 NOTE — Transfer of Care (Signed)
Immediate Anesthesia Transfer of Care Note  Patient: Logan Green  Procedure(s) Performed: IRRIGATION AND DEBRIDEMENT OF THUMB (Left)  Patient Location: PACU  Anesthesia Type:General  Level of Consciousness: awake, alert  and oriented  Airway & Oxygen Therapy: Patient Spontanous Breathing  Post-op Assessment: Report given to RN and Post -op Vital signs reviewed and stable  Post vital signs: Reviewed and stable  Last Vitals:  Vitals Value Taken Time  BP 132\96   Temp 98.7   Pulse 74   Resp 12   SpO2 96     Last Pain:  Vitals:   02/01/22 1734  TempSrc: Oral  PainSc: 0-No pain         Complications: No notable events documented.

## 2022-02-01 NOTE — Progress Notes (Signed)
Postop note: Status post incision and drainage left thumb.  Gross purulence encountered.  Cultures taken.  Wound packed.  Start hydrotherapy in 2 to 4 days.  Okay for discharge from hand standpoint when afebrile, white blood count normalizing, pain controlled.  Can start hydrotherapy as an outpatient.  Follow-up next week.  Okay to start DVT prophylaxis.

## 2022-02-01 NOTE — Consult Note (Addendum)
Reason for Consult:Left thumb infection Referring Physician: Glendora Score Time called: 1229 Time at bedside: 1245   Logan Green is an 71 y.o. male.  HPI: Gaylin was bitten by a feral cat last Thursday. His thumb began to swell, hurt, and turn red. He sought care on Monday and was given abx injection and pills. He returned Tuesday when he was worse and the rx was changed. He comes to the ED today continuing to worsen. He is RHD and retired.  Past Medical History:  Diagnosis Date   Hypertension    Rheumatic fever     Past Surgical History:  Procedure Laterality Date   RIGHT/LEFT HEART CATH AND CORONARY ANGIOGRAPHY N/A 02/14/2021   Procedure: RIGHT/LEFT HEART CATH AND CORONARY ANGIOGRAPHY;  Surgeon: Elder Negus, MD;  Location: [facility];  Service: Cardiovascular;  Laterality: N/A;    Family History  Problem Relation Age of Onset   Heart failure Mother     Social History:  reports that he has been smoking cigarettes. He has a 20.50 pack-year smoking history. He has never used smokeless tobacco. He reports current alcohol use. He reports current drug use. Drug: Marijuana.  Allergies: No Known Allergies  Medications: I have reviewed the patient's current medications.  Results for orders placed or performed during the hospital encounter of 02/01/22 (from the past 48 hour(s))  Basic metabolic panel     Status: Abnormal   Collection Time: 02/01/22 11:49 AM  Result Value Ref Range   Sodium 134 (L) 135 - 145 mmol/L   Potassium 4.5 3.5 - 5.1 mmol/L   Chloride 101 98 - 111 mmol/L   CO2 24 22 - 32 mmol/L   Glucose, Bld 113 (H) 70 - 99 mg/dL    Comment: Glucose reference range applies only to samples taken after fasting for at least 8 hours.   BUN 28 (H) 8 - 23 mg/dL   Creatinine, Ser 5.46 0.61 - 1.24 mg/dL   Calcium 9.2 8.9 - 56.8 mg/dL   GFR, Estimated >12 >75 mL/min    Comment: (NOTE) Calculated using the CKD-EPI Creatinine Equation (2021)    Anion gap 9 5 -  15    Comment: Performed at [facility], [address], [place], [place] [address]  CBC with Differential     Status: Abnormal   Collection Time: 02/01/22 11:49 AM  Result Value Ref Range   WBC 11.3 (H) 4.0 - 10.5 K/uL   RBC 4.39 4.22 - 5.81 MIL/uL   Hemoglobin 14.1 13.0 - 17.0 g/dL   HCT 74.9 44.9 - 67.5 %   MCV 94.8 80.0 - 100.0 fL   MCH 32.1 26.0 - 34.0 pg   MCHC 33.9 30.0 - 36.0 g/dL   RDW 91.6 38.4 - 66.5 %   Platelets 294 150 - 400 K/uL   nRBC 0.0 0.0 - 0.2 %   Neutrophils Relative % 64 %   Neutro Abs 7.2 1.7 - 7.7 K/uL   Lymphocytes Relative 22 %   Lymphs Abs 2.5 0.7 - 4.0 K/uL   Monocytes Relative 10 %   Monocytes Absolute 1.2 (H) 0.1 - 1.0 K/uL   Eosinophils Relative 3 %   Eosinophils Absolute 0.4 0.0 - 0.5 K/uL   Basophils Relative 1 %   Basophils Absolute 0.1 0.0 - 0.1 K/uL   Immature Granulocytes 0 %   Abs Immature Granulocytes 0.04 0.00 - 0.07 K/uL    Comment: Performed at [facility], [address]  [place], [place] [address]    DG Hand Complete Left  Result Date: 02/01/2022 CLINICAL DATA:  Left thumb swelling after cat bite. EXAM: LEFT HAND - COMPLETE 3+ VIEW COMPARISON:  None Available. FINDINGS: There is no evidence of fracture or dislocation. Moderate degenerative changes seen involving the first interphalangeal joint. Mild degenerative changes seen involving the first carpometacarpal joint. Soft tissues are unremarkable. IMPRESSION: Mild-to-moderate degenerative changes as described above. No acute abnormality seen. Electronically Signed   By: Lupita Raider M.D.   On: 02/01/2022 12:34    Review of Systems  Constitutional:  Negative for chills, diaphoresis and fever.  HENT:  Negative for ear discharge, ear pain, hearing loss and tinnitus.   Eyes:  Negative for photophobia and pain.  Respiratory:  Negative for cough and shortness of breath.   Cardiovascular:  Negative for chest pain.  Gastrointestinal:  Negative for  abdominal pain, nausea and vomiting.  Genitourinary:  Negative for dysuria, flank pain, frequency and urgency.  Musculoskeletal:  Positive for arthralgias (Left thumb). Negative for back pain, myalgias and neck pain.  Neurological:  Negative for dizziness and headaches.  Hematological:  Does not bruise/bleed easily.  Psychiatric/Behavioral:  The patient is not nervous/anxious.    Blood pressure 136/78, pulse 92, temperature 98 F (36.7 C), temperature source Oral, resp. rate 19, height 5\' 8"  (1.727 m), weight 83.9 kg, SpO2 99 %. Physical Exam Constitutional:      General: He is not in acute distress.    Appearance: He is well-developed. He is not diaphoretic.  HENT:     Head: Normocephalic and atraumatic.  Eyes:     General: No scleral icterus.       Right eye: No discharge.        Left eye: No discharge.     Conjunctiva/sclera: Conjunctivae normal.  Cardiovascular:     Rate and Rhythm: Normal rate and regular rhythm.  Pulmonary:     Effort: Pulmonary effort is normal. No respiratory distress.  Musculoskeletal:     Cervical back: Normal range of motion.     Comments: Left shoulder, elbow, wrist, digits- Cat bites to dorsal thumb, fusiform edema, paresthesias ulnar/radial aspects, good ROM IP and MCP joints, no instability, no blocks to motion  Sens  Ax/R/M/U intact  Mot   Ax/ R/ PIN/ M/ AIN/ U intact  Rad 2+  Skin:    General: Skin is warm and dry.  Neurological:     Mental Status: He is alert.  Psychiatric:        Mood and Affect: Mood normal.        Behavior: Behavior normal.     Assessment/Plan: Left thumb infection -- Plan I&D this evening by Dr. . Will need admission by medicine for IV abx.    Merlyn Lot, PA-C Orthopedic Surgery [phone] 02/01/2022, 12:53 PM   Addendum (02/01/22): Patient seen at examined.  Agree with above. H: Bitten by feral cat one week ago.  Has had worsening swelling, pain, erythema.  Started on antibiotics Monday but has  worsened despite antibiotics.  No fevers or chills. E: intact sensation and capillary refill all digits.  Able to flex and extend left thumb at mp and ip joints without pain.  Swelling and erythema dorsum of thumb over proximal phalanx.  Erythema into hand.  No proximal streaking.  Non tender volarly.  Non tender at mp and ip joints volarly.  States it is sore with palpation on sides of joint.  No pain with passive motion of  joints. XR: left hand: no fractures, dislocations, radioopaque foreign bodies.  A/P: left thumb abscess from cat bite.  Recommend incision and drainage in OR.  Risks, benefits and alternatives of surgery were discussed including risks of blood loss, infection, damage to nerves/vessels/tendons/ligament/bone, failure of surgery, need for additional surgery, complication with wound healing, stiffness, need for repeat irrigation and debridement.  He voiced understanding of these risks and elected to proceed.

## 2022-02-01 NOTE — Op Note (Signed)
NAME: Logan Green MEDICAL RECORD NO: [id number] DATE OF BIRTH: October 18, 1950 FACILITY: [facility] LOCATION: [facility] OR PHYSICIAN: Tami Ribas, MD   OPERATIVE REPORT   DATE OF PROCEDURE: 02/01/22    PREOPERATIVE DIAGNOSIS: Left thumb infected cat bite   POSTOPERATIVE DIAGNOSIS: Left thumb infected cat bite   PROCEDURE: 1.  Incision and drainage left thumb infected cat bite including MP and IP joints   SURGEON:  Betha Loa, M.D.   ASSISTANT: none   ANESTHESIA:  General   INTRAVENOUS FLUIDS:  Per anesthesia flow sheet.   ESTIMATED BLOOD LOSS:  Minimal.   COMPLICATIONS:  None.   SPECIMENS: Cultures to micro   TOURNIQUET TIME:    Total Tourniquet Time Documented: Upper Arm (Left) - 26 minutes Total: Upper Arm (Left) - 26 minutes    DISPOSITION:  Stable to PACU.   INDICATIONS: 71 year old male states he was bitten on the left thumb by a feral cat approximately 1 week ago.  He has had progressively worsening swelling and erythema of the thumb.  He was started on antibiotics few days ago but has had worsening despite this.  I recommended incision and drainage in the operating room.  Risks, benefits and alternatives of surgery were discussed including the risks of blood loss, infection, damage to nerves, vessels, tendons, ligaments, bone for surgery, need for additional surgery, complications with wound healing, continued pain, stiffness, , need for repeat irrigation and debridement.  He voiced understanding of these risks and elected to proceed.  OPERATIVE COURSE:  After being identified preoperatively by myself,  the patient and I agreed on the procedure and site of the procedure.  The surgical site was marked.  Surgical consent had been signed.  He is on scheduled antibiotics.  He was transferred to the operating room and placed on the operating table in supine position with the Left upper extremity on an arm board.  General anesthesia was induced by the anesthesiologist.  Left  upper extremity was prepped and draped in normal sterile orthopedic fashion.  A surgical pause was performed between the surgeons, anesthesia, and operating room staff and all were in agreement as to the patient, procedure, and site of procedure.  Tourniquet at the proximal aspect of the extremity was inflated to 250 mmHg after exsanguination of the arm with an Esmarch bandage.  Incision was made on the dorsum of the thumb.  Gross purulence was encountered.  Cultures were taken for aerobes and anaerobes.  The soft tissues were entered by spreading technique.  Purulence was removed with the Ray-Tec sponge.  There was erosion of the central portion of the EPL tendon distally.  The radial and ulnar sides of the tendon were intact.  The IP joint was exposed.  The central portion of the tendon was Goulian soft and nonviable.  This was sharply debrided with the scissors.  Bipolar electrocautery was used to obtain hemostasis.  There was a bite wound directly over the MP joint.  The MP joint was entered from the radial side of the thumb.  A portion of the aponeurosis was released to allow access.  There was no gross purulence noted within the MP joint.  The skin and subcutaneous tissues were sharply debrided with a knife to remove devitalized tissues.  The MP and IP joints and the wound itself were copiously irrigated with sterile saline by bulb syringe.  Iodoform gauze wicks were placed into the MP and IP joints and the wound packed with quarter inch iodoform gauze.  Core  percent plain Marcaine was injected to aid in postoperative analgesia.  Wound was dressed with sterile 4 x 4's and wrapped with a Kerlix bandage.  Thumb spica splint was placed and wrapped with Kerlix and Ace bandage.  The tourniquet was deflated at 26 minutes.  Fingertips were pink with brisk capillary refill after deflation of tourniquet.  The operative  drapes were broken down.  The patient was awoken from anesthesia safely.  He was transferred back to  the stretcher and taken to PACU in stable condition.  He is admitted to the hospitalist for IV antibiotics.   Betha Loa, MD Electronically signed, 02/01/22

## 2022-02-01 NOTE — ED Triage Notes (Addendum)
Patient reports animal bite by cat last Thursday to left thumb. Infection, redness and inflamed - referred here for IV antibiotics.

## 2022-02-01 NOTE — ED Notes (Signed)
Pt transported to [facility] at this time with [facility]. OR made aware.

## 2022-02-01 NOTE — ED Provider Triage Note (Signed)
Emergency Medicine Provider Triage Evaluation Note  Logan Green , a 71 y.o. male  was evaluated in triage.  Pt complains of swelling, redness, and drainage at left thumb after cat bite.  Patient was bitten by a feral cat last Thursday.  He has been evaluated in urgent care multiple times and received at least 2 doses of IM Rocephin, and placed on doxycycline and Augmentin.  Redness around the bite has worsened and has begun to drain purulent material.  He was seen today in urgent care for repeat visit and they recommended he come to the emergency department for possible IV antibiotics or other treatment.  Denies fever, nausea, vomiting  Review of Systems  Positive: Erythema, pain, swelling, drainage at left thumb Negative: Abdominal pain, nausea, vomiting  Physical Exam  There were no vitals taken for this visit. Gen:   Awake, no distress   Resp:  Normal effort  MSK:   Moves extremities without difficulty  Other:    Medical Decision Making  Medically screening exam initiated at 11:26 AM.  Appropriate orders placed.  Logan Green was informed that the remainder of the evaluation will be completed by another provider, this initial triage assessment does not replace that evaluation, and the importance of remaining in the ED until their evaluation is complete.     Darrick Grinder, PA-C 02/01/22 1128

## 2022-02-01 NOTE — Anesthesia Preprocedure Evaluation (Addendum)
Anesthesia Evaluation  Patient identified by MRN, date of birth, ID band Patient awake    Reviewed: Allergy & Precautions, H&P , NPO status , Patient's Chart, lab work & pertinent test results, reviewed documented beta blocker date and time   Airway Mallampati: II  TM Distance: >3 FB     Dental no notable dental hx.    Pulmonary COPD,  COPD inhaler, Current Smoker and Patient abstained from smoking.,    Pulmonary exam normal        Cardiovascular hypertension, Pt. on medications and Pt. on home beta blockers  Rhythm:Regular Rate:Normal     Neuro/Psych Anxiety negative neurological ROS     GI/Hepatic negative GI ROS, Neg liver ROS,   Endo/Other  negative endocrine ROS  Renal/GU negative Renal ROS  negative genitourinary   Musculoskeletal   Abdominal Normal abdominal exam  (+)   Peds  Hematology negative hematology ROS (+)   Anesthesia Other Findings   Reproductive/Obstetrics negative OB ROS                            Anesthesia Physical Anesthesia Plan  ASA: 3  Anesthesia Plan: General   Post-op Pain Management: Ofirmev IV (intra-op)*   Induction: Intravenous  PONV Risk Score and Plan: 2 and Ondansetron and Dexamethasone  Airway Management Planned: LMA, Oral ETT and Mask  Additional Equipment: None  Intra-op Plan:   Post-operative Plan: Extubation in OR  Informed Consent: I have reviewed the patients History and Physical, chart, labs and discussed the procedure including the risks, benefits and alternatives for the proposed anesthesia with the patient or authorized representative who has indicated his/her understanding and acceptance.     Dental advisory given  Plan Discussed with: CRNA  Anesthesia Plan Comments:        Anesthesia Quick Evaluation

## 2022-02-01 NOTE — H&P (Signed)
History and Physical    Patient: Logan Green IWP:[id number] DOB: 11-Dec-1950 DOA: 02/01/2022 DOS: the patient was seen and examined on 02/01/2022 PCP: [name], MD  Patient coming from: Home  Chief Complaint:  Chief Complaint  Patient presents with   Animal Bite   HPI: Logan Green is a 71 y.o. male with medical history significant of HFrEF, CAD, HTN, HLD, anxiety/depression. Presenting with left thumb swelling. He was bitten by a feral cat that he was trying to clear out of his basement about 1 week ago. 3 days ago it had become painful and swollen. So, he went to urgent care. He was given a dose of IM rocephin and sent home with abx. He made visits back to urgent care yesterday and today because his thumb was not improving. Today, they recommended that he come to the ED for evaluation. He denies any fevers, chills. He reports some drainage. He denies any other aggravating or alleviating factors.   Review of Systems: As mentioned in the history of present illness. All other systems reviewed and are negative. Past Medical History:  Diagnosis Date   Hypertension    Rheumatic fever    Past Surgical History:  Procedure Laterality Date   RIGHT/LEFT HEART CATH AND CORONARY ANGIOGRAPHY N/A 02/14/2021   Procedure: RIGHT/LEFT HEART CATH AND CORONARY ANGIOGRAPHY;  Surgeon: Nigel Mormon, MD;  Location: [facility];  Service: Cardiovascular;  Laterality: N/A;   Social History:  reports that he has been smoking cigarettes. He has a 20.50 pack-year smoking history. He has never used smokeless tobacco. He reports current alcohol use. He reports current drug use. Drug: Marijuana.  No Known Allergies  Family History  Problem Relation Age of Onset   Heart failure Mother     Prior to Admission medications   Medication Sig Start Date End Date Taking? Authorizing Provider  albuterol (VENTOLIN HFA) 108 (90 Base) MCG/ACT inhaler Inhale 1 puff into the lungs daily as  needed for wheezing or shortness of breath. 01/20/21   [provider]  aspirin 81 MG tablet Take 1 tablet (81 mg total) by mouth daily. 10/31/17   Jaynee Eagles, PA-C  atorvastatin (LIPITOR) 40 MG tablet Take 1 tablet (40 mg total) by mouth daily. 11/30/17   Jaynee Eagles, PA-C  citalopram (CELEXA) 10 MG tablet Take 10 mg by mouth daily. 10/05/21   [provider]  fluticasone (FLONASE) 50 MCG/ACT nasal spray Place into both nostrils daily.    [provider]  furosemide (LASIX) 20 MG tablet Take 1 tablet (20 mg total) by mouth daily as needed for fluid or edema. 04/21/21 07/20/21  Cantwell, Celeste C, PA-C  hydrOXYzine (ATARAX) 25 MG tablet Take 25 mg by mouth at bedtime. 10/05/21   [provider]  metoprolol succinate (TOPROL-XL) 25 MG 24 hr tablet Take 1 tablet (25 mg total) by mouth daily. 03/06/21   Patwardhan, Reynold Bowen, MD  omeprazole (PRILOSEC) 20 MG capsule Take 20 mg by mouth daily.    [provider]  sacubitril-valsartan (ENTRESTO) 24-26 MG Take 1 tablet by mouth 2 (two) times daily. 11/06/21   Alethia Berthold, PA-C    Physical Exam: Vitals:   02/01/22 1136 02/01/22 1151 02/01/22 1305  BP: 136/78  (!) 107/55  Pulse: 92  71  Resp: 19  18  Temp: 98 F (36.7 C)    TempSrc: Oral    SpO2: 99%  96%  Weight:  83.9 kg   Height:  _0  (1.727  m)    General: 71 y.o. male resting in bed in NAD Eyes: PERRL, normal sclera ENMT: Nares patent w/o discharge, orophaynx clear, dentition normal, ears w/o discharge/lesions/ulcers Neck: Supple, trachea midline Cardiovascular: RRR, +S1, S2, no m/g/r, equal pulses throughout Respiratory: CTABL, no w/r/r, normal WOB GI: BS+, NDNT, no masses noted, no organomegaly noted MSK: No c/c; left thumb swelling with limited ROM Neuro: A&O x 3, no focal deficits Psyc: Appropriate interaction and affect, calm/cooperative    Media Information   Document Information  Photos    02/01/2022 12:03  Attached To:   Hospital Encounter on 02/01/22   Source Information  Kommor, [place], MD  [facility]    Data Reviewed:  Lab Results  Component Value Date   NA 134 (L) 02/01/2022   K 4.5 02/01/2022   CO2 24 02/01/2022   GLUCOSE 113 (H) 02/01/2022   BUN 28 (H) 02/01/2022   CREATININE 1.09 02/01/2022   CALCIUM 9.2 02/01/2022   EGFR 92 04/11/2021   GFRNONAA >60 02/01/2022   Lab Results  Component Value Date   WBC 11.3 (H) 02/01/2022   HGB 14.1 02/01/2022   HCT 41.6 02/01/2022   MCV 94.8 02/01/2022   PLT 294 02/01/2022   XR Left hand: Mild-to-moderate degenerative changes as described above. No acute abnormality seen.  Assessment and Plan: Left thumb cellulitis     - admit to inpt, tele @ [facility]     - ortho to take to OR tonight, keep NPO for now     - vanc, unasyn     - pain control  Chronic HFrEF     - not in exacerbation     - resume home regimen when off NPO status     - will get some fluids today; follow I&O/daily wts  Anxiety/Depression     - resume home regimen when off NPO status  HTN     - resume home regimen when off NPO status     - PRN available  CAD     - resume home regimen when off NPO status  Tobacco abuse     - counsel against further use  Advance Care Planning:   Code Status: FULL  Consults: Orthopedics (Dr. Fredna Dow)  Family Communication: w/ ex-wife at bedside  Severity of Illness: The appropriate patient status for this patient is INPATIENT. Inpatient status is judged to be reasonable and necessary in order to provide the required intensity of service to ensure the patient's safety. The patient's presenting symptoms, physical exam findings, and initial radiographic and laboratory data in the context of their chronic comorbidities is felt to place them at high risk for further clinical deterioration. Furthermore, it is not anticipated that the patient will be medically stable for discharge from the hospital within 2 midnights of admission.   * I  certify that at the point of admission it is my clinical judgment that the patient will require inpatient hospital care spanning beyond 2 midnights from the point of admission due to high intensity of service, high risk for further deterioration and high frequency of surveillance required.*  Author: Jonnie Finner, DO 02/01/2022 2:03 PM  For on call review [email or web address].

## 2022-02-01 NOTE — Anesthesia Postprocedure Evaluation (Signed)
Anesthesia Post Note  Patient: Logan Green  Procedure(s) Performed: IRRIGATION AND DEBRIDEMENT OF THUMB (Left)     Patient location during evaluation: PACU Anesthesia Type: General Level of consciousness: awake and alert Pain management: pain level controlled Vital Signs Assessment: post-procedure vital signs reviewed and stable Respiratory status: spontaneous breathing, nonlabored ventilation, respiratory function stable and patient connected to nasal cannula oxygen Cardiovascular status: blood pressure returned to baseline and stable Postop Assessment: no apparent nausea or vomiting Anesthetic complications: no   No notable events documented.  Last Vitals:  Vitals:   02/01/22 2100 02/01/22 2119  BP: 134/66 132/82  Pulse: 72 71  Resp: 16 17  Temp: 36.9 C 36.5 C  SpO2: 95% 98%    Last Pain:  Vitals:   02/01/22 2119  TempSrc: Oral  PainSc:                  [name]

## 2022-02-01 NOTE — Progress Notes (Signed)
PHARMACY -  BRIEF ANTIBIOTIC NOTE   Pharmacy has received consult(s) for vancomycin and Zosyn from an ED provider.  The patient's profile has been reviewed for ht/wt/allergies/indication/available labs.    One time order(s) placed for vancomycin 1750 mg + Zosyn 3.375 g  Further antibiotics/pharmacy consults should be ordered by admitting physician if indicated.                       Thank you,  Pricilla Riffle, PharmD, BCPS Clinical Pharmacist 02/01/2022 12:29 PM

## 2022-02-01 NOTE — Anesthesia Procedure Notes (Signed)
Procedure Name: LMA Insertion Date/Time: 02/01/2022 7:14 PM  Performed by: Molli Hazard, CRNAPre-anesthesia Checklist: Patient identified, Emergency Drugs available, Suction available and Patient being monitored Patient Re-evaluated:Patient Re-evaluated prior to induction Oxygen Delivery Method: Circle system utilized Preoxygenation: Pre-oxygenation with 100% oxygen Induction Type: IV induction LMA: LMA inserted LMA Size: 4.0 Number of attempts: 1 Placement Confirmation: positive ETCO2 Tube secured with: Tape Dental Injury: Teeth and Oropharynx as per pre-operative assessment

## 2022-02-01 NOTE — Progress Notes (Addendum)
Pharmacy Antibiotic Note  Logan Green is a 71 y.o. male admitted on 02/01/2022 with wound to thumb. He was bitten by a feral cat one week ago (Thursday 7/13). He presented for care outpatient on Monday and received IM antibiotics (Rocephin) and given oral antibiotics (doxycycline, Augmentin). He presents with worsening swelling as well as drainage to area. Orthopedics consulted and plan for I&D to left thumb 7/20 evening. Patient to receive rabies vaccine and immune globulin. Pharmacy has been consulted for vancomycin and Unasyn dosing.  Plan: Vancomycin 1750 mg and Zosyn 3.375 g given in the ED  -Continue with vancomycin 1750 mg IV q24h -Start Unasyn 3 g IV q6h -Pharmacy to continue to follow renal function, cultures and clinical progress for dose adjustments and de-escalation as indicated  Height: 5\' 8"  (172.7 cm) Weight: 83.9 kg (185 lb) IBW/kg (Calculated) : 68.4  Temp (24hrs), Avg:98 F (36.7 C), Min:98 F (36.7 C), Max:98 F (36.7 C)  Recent Labs  Lab 02/01/22 1149  WBC 11.3*  CREATININE 1.09    Estimated Creatinine Clearance: 68.4 mL/min (by C-G formula based on SCr of 1.09 mg/dL).    No Known Allergies  Antimicrobials this admission: Unasyn 7/20 >> Vancomycin 7/20 >> Zosyn 7/20 x 1  Microbiology results: 7/20 Wound (left finger): pending  Thank you for allowing pharmacy to be a part of this patient's care.   8/20, PharmD, BCPS Clinical Pharmacist 02/01/2022 2:50 PM

## 2022-02-01 NOTE — Discharge Instructions (Signed)

## 2022-02-02 ENCOUNTER — Encounter (HOSPITAL_COMMUNITY): Payer: Self-pay | Admitting: Orthopedic Surgery

## 2022-02-02 DIAGNOSIS — L03012 Cellulitis of left finger: Secondary | ICD-10-CM | POA: Diagnosis not present

## 2022-02-02 LAB — HIV ANTIBODY (ROUTINE TESTING W REFLEX): HIV Screen 4th Generation wRfx: NONREACTIVE

## 2022-02-02 LAB — AEROBIC CULTURE W GRAM STAIN (SUPERFICIAL SPECIMEN)

## 2022-02-02 LAB — COMPREHENSIVE METABOLIC PANEL
ALT: 17 U/L (ref 0–44)
AST: 16 U/L (ref 15–41)
Albumin: 3.4 g/dL — ABNORMAL LOW (ref 3.5–5.0)
Alkaline Phosphatase: 61 U/L (ref 38–126)
Anion gap: 6 (ref 5–15)
BUN: 24 mg/dL — ABNORMAL HIGH (ref 8–23)
CO2: 24 mmol/L (ref 22–32)
Calcium: 9.4 mg/dL (ref 8.9–10.3)
Chloride: 106 mmol/L (ref 98–111)
Creatinine, Ser: 1.03 mg/dL (ref 0.61–1.24)
GFR, Estimated: >60 mL/min (ref >60)
Glucose, Bld: 145 mg/dL — ABNORMAL HIGH (ref 70–99)
Potassium: 5 mmol/L (ref 3.5–5.1)
Sodium: 136 mmol/L (ref 135–145)
Total Bilirubin: 1 mg/dL (ref 0.3–1.2)
Total Protein: 7.2 g/dL (ref 6.5–8.1)

## 2022-02-02 LAB — CBC
HCT: 41.9 % (ref 39.0–52.0)
Hemoglobin: 14.3 g/dL (ref 13.0–17.0)
MCH: 31.6 pg (ref 26.0–34.0)
MCHC: 34.1 g/dL (ref 30.0–36.0)
MCV: 92.5 fL (ref 80.0–100.0)
Platelets: 294 10*3/uL (ref 150–400)
RBC: 4.53 MIL/uL (ref 4.22–5.81)
RDW: 11.3 % — ABNORMAL LOW (ref 11.5–15.5)
WBC: 9.8 10*3/uL (ref 4.0–10.5)
nRBC: 0 % (ref 0.0–0.2)

## 2022-02-02 MED ORDER — HYDROCODONE-ACETAMINOPHEN 5-325 MG PO TABS: 1.0000 | ORAL_TABLET | ORAL | 0 refills | Status: AC | PRN

## 2022-02-02 MED ORDER — AMOXICILLIN-POT CLAVULANATE 875-125 MG PO TABS: 1.0000 | ORAL_TABLET | Freq: Two times a day (BID) | ORAL | 0 refills | Status: AC

## 2022-02-02 NOTE — Discharge Summary (Signed)
Physician Discharge Summary   Patient: Logan Green MRN: [id number] DOB: 04-28-1951  Admit date:     02/01/2022  Discharge date: 02/02/22  Discharge Physician: Jacquelin Hawking, MD   PCP: Jackie Plum, MD   Recommendations at discharge:  Orthopedic surgery follow-up  Discharge Diagnoses: Principal Problem:   Cellulitis of finger of left hand Active Problems:   Essential hypertension   HFrEF (heart failure with reduced ejection fraction) (HCC)   Coronary artery disease involving native coronary artery of native heart without angina pectoris   Tobacco abuse   Anxiety  Resolved Problems:   * No resolved hospital problems. *  Assessment and Plan:  Left thumb cellulitis Cat bite Hand x-ray significant for no fracture and no mention of osteomyelitis. Patient started empirically on Vancomycin Zosyn and transitioned to Unasyn. Orthopedic surgery consulted on admission and recommended I&D in the OR, which was performed on 7/21. Patient discharged on Augmentin with recommendation for outpatient orthopedic surgery and hydrotherapy follow-up for wound management.  Chronic systolic heart failure Anxiety Depression Primary hypertension CAD Stable. No changes to home regimen.  Tobacco use Counseled on admission against continued tobacco use.   Consultants: Orthopedic surgery Procedures performed: Left thumb I&D  Disposition: Home Diet recommendation: Heart healthy  DISCHARGE MEDICATION: Allergies as of 02/02/2022   No Known Allergies      Medication List     STOP taking these medications    doxycycline 100 MG capsule Commonly known as: VIBRAMYCIN       TAKE these medications    acetaminophen 500 MG tablet Commonly known as: TYLENOL Take 1,000 mg by mouth every 6 (six) hours as needed for mild pain.   albuterol 108 (90 Base) MCG/ACT inhaler Commonly known as: VENTOLIN HFA Inhale 1 puff into the lungs daily as needed for wheezing or shortness of breath.    amoxicillin-clavulanate 875-125 MG tablet Commonly known as: AUGMENTIN Take 1 tablet by mouth 2 (two) times daily for 7 days.   aspirin 81 MG tablet Take 1 tablet (81 mg total) by mouth daily.   atorvastatin 40 MG tablet Commonly known as: LIPITOR Take 1 tablet (40 mg total) by mouth daily.   Entresto 24-26 MG Generic drug: sacubitril-valsartan Take 1 tablet by mouth 2 (two) times daily.   fluticasone 50 MCG/ACT nasal spray Commonly known as: FLONASE Place 1 spray into both nostrils daily as needed for allergies.   HYDROcodone-acetaminophen 5-325 MG tablet Commonly known as: Norco Take 1-2 tablets by mouth every 4 (four) hours as needed for up to 7 days for moderate pain.   metoprolol succinate 25 MG 24 hr tablet Commonly known as: TOPROL-XL Take 1 tablet (25 mg total) by mouth daily.   omeprazole 20 MG capsule Commonly known as: PRILOSEC Take 20 mg by mouth daily.        Follow-up Information     Betha Loa, MD Follow up on 02/05/2022.   Specialty: Orthopedic Surgery Contact information: [address] [place] [address] [phone]         Jackie Plum, MD. Schedule an appointment as soon as possible for a visit in 1 week(s).   Specialty: Internal Medicine Why: For hospital follow-up Contact information: [address] [place] [address] [phone]                Discharge Exam: BP 131/62 (BP Location: Left Arm)   Pulse 94   Temp 98.2 F (36.8 C) (Oral)   Resp 18   Ht 5\' 8"  (  1.727 m)   Wt 83.9 kg   SpO2 97%   BMI 28.13 kg/m   General exam: Appears calm and comfortable Respiratory system: Clear to auscultation. Respiratory effort normal. Cardiovascular system: S1 & S2 heard, RRR. Gastrointestinal system: Abdomen is nondistended, soft and nontender. Normal bowel sounds heard. Central nervous system: Alert and oriented. No focal neurological deficits. Skin: No cyanosis. No rashes Psychiatry: Judgement and  insight appear normal. Mood & affect appropriate.   Condition at discharge: stable  The results of significant diagnostics from this hospitalization (including imaging, microbiology, ancillary and laboratory) are listed below for reference.   Imaging Studies: DG Hand Complete Left  Result Date: 02/01/2022 CLINICAL DATA:  Left thumb swelling after cat bite. EXAM: LEFT HAND - COMPLETE 3+ VIEW COMPARISON:  None Available. FINDINGS: There is no evidence of fracture or dislocation. Moderate degenerative changes seen involving the first interphalangeal joint. Mild degenerative changes seen involving the first carpometacarpal joint. Soft tissues are unremarkable. IMPRESSION: Mild-to-moderate degenerative changes as described above. No acute abnormality seen. Electronically Signed   By: Lupita Raider M.D.   On: 02/01/2022 12:34    Microbiology: Results for orders placed or performed during the hospital encounter of 02/01/22  Aerobic Culture w Gram Stain (superficial specimen)     Status: None   Collection Time: 02/01/22 12:15 PM   Specimen: Joint, Finger; Wound  Result Value Ref Range Status   Specimen Description   Final    FINGER LEFT Performed at [facility], [address], [place], [place] [address]    Special Requests   Final    NONE Performed at [facility], [address] [place], [place] [address]    Gram Stain   Final    RARE WBC PRESENT, PREDOMINANTLY PMN NO ORGANISMS SEEN    Culture   Final    RARE PASTEURELLA MULTOCIDA Usually susceptible to penicillin and other beta lactam agents,quinolones,macrolides and tetracyclines. Performed at [facility], [address], [place], [place] [address]    Report Status 02/02/2022 FINAL  Final  Surgical PCR screen     Status: None   Collection Time: 02/01/22  5:11 PM   Specimen: Nasal Mucosa; Nasal Swab  Result Value Ref Range Status   MRSA, PCR NEGATIVE NEGATIVE Final   Staphylococcus  aureus NEGATIVE NEGATIVE Final    Comment: (NOTE) The Xpert SA Assay (FDA approved for NASAL specimens in patients 70 years of age and older), is one component of a comprehensive surveillance program. It is not intended to diagnose infection nor to guide or monitor treatment. Performed at [facility], [address], [place], [place] [address]   Aerobic/Anaerobic Culture w Gram Stain (surgical/deep wound)     Status: None (Preliminary result)   Collection Time: 02/01/22  7:31 PM   Specimen: Abscess  Result Value Ref Range Status   Specimen Description ABSCESS  Final   Special Requests THUMB  Final   Gram Stain   Final    FEW WBC PRESENT,BOTH PMN AND MONONUCLEAR NO ORGANISMS SEEN Performed at  [facility], [address] [place], [place] [address]    Culture PENDING  Incomplete   Report Status PENDING  Incomplete    Labs: CBC: Recent Labs  Lab 02/01/22 1149 02/02/22 0408  WBC 11.3* 9.8  NEUTROABS 7.2  --   HGB 14.1 14.3  HCT 41.6 41.9  MCV 94.8 92.5  PLT 294 294   Basic Metabolic Panel: Recent Labs  Lab 02/01/22 1149 02/02/22 0408  NA 134* 136  K 4.5 5.0  CL 101 106  CO2 24 24  GLUCOSE 113* 145*  BUN 28* 24*  CREATININE 1.09 1.03  CALCIUM 9.2 9.4   Liver Function Tests: Recent Labs  Lab 02/02/22 0408  AST 16  ALT 17  ALKPHOS 61  BILITOT 1.0  PROT 7.2  ALBUMIN 3.4*   Discharge time spent: 35 minutes.  Signed: Jacquelin Hawking, MD [facility] 02/02/2022

## 2022-02-02 NOTE — Plan of Care (Signed)

## 2022-02-02 NOTE — TOC Transition Note (Signed)
Transition of Care [facility]) - CM/SW Discharge Note   Patient Details  Name: Logan Green MRN: [id number] Date of Birth: [birth year]  [facility]) CM/SW Contact:  Tom-Johnson, Hershal Coria, RN Phone Number: 02/02/2022, 3:17 PM   Clinical Narrative:     Patient is scheduled for discharge today. Had I&D to Left thumb today. No TOC needs or recommendations noted.  Denies any needs. EX-wife to transport at discharge. No further TOC needs noted.   Final next level of care: Home/Self Care Barriers to Discharge: Barriers Resolved   Patient Goals and CMS Choice Patient states their goals for this hospitalization and ongoing recovery are:: To return home CMS Medicare.gov Compare Post Acute Care list provided to:: Patient Choice offered to / list presented to : NA  Discharge Placement                Patient to be transferred to facility by: Ex-wife      Discharge Plan and Services                DME Arranged: N/A DME Agency: NA       HH Arranged: NA HH Agency: NA        Social Determinants of Health (SDOH) Interventions     Readmission Risk Interventions     No data to display

## 2022-02-05 LAB — AEROBIC/ANAEROBIC CULTURE W GRAM STAIN (SURGICAL/DEEP WOUND)

## 2022-05-07 ENCOUNTER — Encounter: Payer: Self-pay | Admitting: Cardiology

## 2022-05-07 ENCOUNTER — Ambulatory Visit: Payer: Medicare (Managed Care) | Admitting: Cardiology

## 2022-05-07 VITALS — BP 138/70 | HR 83 | Temp 98.0°F | Resp 16 | Ht 68.0 in | Wt 183.0 lb

## 2022-05-07 DIAGNOSIS — I5020 Unspecified systolic (congestive) heart failure: Secondary | ICD-10-CM

## 2022-05-07 DIAGNOSIS — I2510 Atherosclerotic heart disease of native coronary artery without angina pectoris: Secondary | ICD-10-CM

## 2022-05-07 MED ORDER — EMPAGLIFLOZIN 10 MG PO TABS: 10.0000 mg | ORAL_TABLET | Freq: Every day | ORAL | 5 refills | Status: DC

## 2022-05-07 NOTE — Progress Notes (Signed)
Follow up visit  Subjective:   Logan Green, male    DOB: 1951/06/08, 71 y.o.   MRN: [id number]    HPI  Chief Complaint  Patient presents with   HFrEF   Coronary Artery Disease    71 y.o. Caucasian male with hypertension, prediabetes, CAD with RCA CTO, nonischemic cardiomyopathy with recovered LVEF  Patient is doing well. Breathing has improved, leg edema is only occasional. He admits to have been eating a lot of candies lately, coinciding with his birthday.   Current Outpatient Medications:    acetaminophen (TYLENOL) 500 MG tablet, Take 1,000 mg by mouth every 6 (six) hours as needed for mild pain., Disp: , Rfl:    albuterol (VENTOLIN HFA) 108 (90 Base) MCG/ACT inhaler, Inhale 1 puff into the lungs daily as needed for wheezing or shortness of breath., Disp: , Rfl:    aspirin 81 MG tablet, Take 1 tablet (81 mg total) by mouth daily., Disp: 90 tablet, Rfl: 1   atorvastatin (LIPITOR) 40 MG tablet, Take 1 tablet (40 mg total) by mouth daily., Disp: 90 tablet, Rfl: 3   fluticasone (FLONASE) 50 MCG/ACT nasal spray, Place 1 spray into both nostrils daily as needed for allergies., Disp: , Rfl:    metoprolol succinate (TOPROL-XL) 25 MG 24 hr tablet, Take 1 tablet (25 mg total) by mouth daily., Disp: 1 tablet, Rfl: 0   omeprazole (PRILOSEC) 20 MG capsule, Take 20 mg by mouth daily., Disp: , Rfl:    sacubitril-valsartan (ENTRESTO) 24-26 MG, Take 1 tablet by mouth 2 (two) times daily., Disp: 180 tablet, Rfl: 3   Cardiovascular & other pertient studies:  Reviewed external labs and tests, independently interpreted  EKG 05/07/2022: Sinus rhythm 83 bpm  Old inferior-apical infarct   Coronary angiography 02/14/2022: LM: Normal LAD: Prox and mid medial calcification with mid focal 30% stenosis Lcx: Small, normal RCA: Mid 100% occlusion         Faint left-to-right collaterals filling diffusely disease PDA and PLA vessels   Normal filling pressures   Well compensated  cardiomyopathy, likely nonischemic. Continue GDMT for HFrEF If exertional dyspnea persists in spite of optimal treatment for HFrEF, could consider MRI to assess viability in chronically occluded RCA territory.    Echocardiogram 10/23/2021:  Left ventricle cavity is normal in size and wall thickness. Normal global  wall motion. Normal LV systolic function with visual EF 50-55%. Doppler  evidence of grade II (pseudonormal) diastolic dysfunction, elevated LAP.  Left atrial cavity is mildly dilated.  Trileaflet aortic valve. Mild aortic valve leaflet thickening. Trace  aortic valve stenosis.  Mild calcification of the mitral valve annulus. Mild to moderate mitral  regurgitation.  Mild tricuspid regurgitation.  Mild pulmonic regurgitation.  No evidence of pulmonary hypertension.  Previous study on 01/24/2021 reported LVEF 20-25%, mod LA dilatation.  Recent labs: 02/02/2022: Glucose 145, BUN/Cr 24/1.03. EGFR >60. Na/K 136/5.0. Albumin 3.4. Rest of the CMP normal H/H 14/41. MCV 92. Platelets 294   Review of Systems  Cardiovascular:  Positive for leg swelling (Occasional). Negative for chest pain, dyspnea on exertion, palpitations and syncope.       Vitals:   05/07/22 1312  BP: 138/70  Pulse: 83  Resp: 16  Temp: 98 F (36.7 C)  SpO2: 98%    Body mass index is 27.83 kg/m. Filed Weights   05/07/22 1312  Weight: 183 lb (83 kg)     Objective:   Physical Exam Vitals and nursing note reviewed.  Constitutional:  General: He is not in acute distress. Neck:     Vascular: No JVD.  Cardiovascular:     Rate and Rhythm: Normal rate and regular rhythm.     Heart sounds: Normal heart sounds. No murmur heard. Pulmonary:     Effort: Pulmonary effort is normal.     Breath sounds: Normal breath sounds. No wheezing or rales.  Musculoskeletal:     Right lower leg: No edema.     Left lower leg: No edema.             Visit diagnoses:   ICD-10-CM   1. HFrEF (heart failure  with reduced ejection fraction) (HCC)  I50.20 EKG 12-Lead    empagliflozin (JARDIANCE) 10 MG TABS tablet    Basic metabolic panel    2. Coronary artery disease involving native coronary artery of native heart without angina pectoris  I25.10        Orders Placed This Encounter  Procedures   EKG 12-Lead     Medication changes this visit: Meds ordered this encounter  Medications   empagliflozin (JARDIANCE) 10 MG TABS tablet    Sig: Take 1 tablet (10 mg total) by mouth daily before breakfast.    Dispense:  30 tablet    Refill:  5     Assessment & Recommendations:   71 y.o. Caucasian male with coronary artery disease, likely nonischemic cardiomyopathy, tobacco dependence, prior history of alcohol abuse.     Chronic systolic heart failure:  RCA CTO but otherwise no significant CAD. Therefore, HFrEF likely nonischemic cardiomyopathuy.  EF recovered to 50-55% (echocardiogram 10/2021). Continue metoprolol and Entresto Added Jardiance 10 mg daily, given occasional leg edema, as well as prediabetes. Encouraged hydration. Check BMP in 1 month.   CAD: RCA CTO - nonobstructive  Continue Aspirin, statin, metoprolol succinate   F/u in 6 months    Nigel Mormon, MD Pager: [phone] Office: [phone]

## 2022-07-11 ENCOUNTER — Other Ambulatory Visit: Payer: Self-pay

## 2022-07-11 MED ORDER — ENTRESTO 24-26 MG PO TABS: 1.0000 | ORAL_TABLET | Freq: Two times a day (BID) | ORAL | 3 refills | Status: DC

## 2022-09-06 NOTE — Progress Notes (Signed)
Follow up visit  Subjective:   Logan Green, male    DOB: [birth year], 72 y.o.   MRN: [id number]    HPI  Chief Complaint  Patient presents with   HFrEF   Loss of Consciousness   Follow-up    72 y.o. Caucasian male with hypertension, prediabetes, CAD with RCA CTO, nonischemic cardiomyopathy with recovered LVEF  Patient is here with his mother.  Most of the history is provided by her.  Patient appears confused about events.  For example, was telling me about an episode that occurred last week, but referred to it as something happened around Thanksgiving, and was corrected by spouse.  He reportedly had an disorder of left-sided numbness, followed by loss of consciousness.  He woke up with his pet licking his face.  He does not recall having had any chest pain or shortness of breath, palpitations chest pain or this episode.  That said, he does report exertional dyspnea with usual activity.  He denies any chest pain.    It appears that he has not been taking Jardiance for a while due to cost issues.  He denies any orthopnea, PND, leg edema symptoms.   Current Outpatient Medications:    acetaminophen (TYLENOL) 500 MG tablet, Take 1,000 mg by mouth every 6 (six) hours as needed for mild pain., Disp: , Rfl:    albuterol (VENTOLIN HFA) 108 (90 Base) MCG/ACT inhaler, Inhale 1 Green into the lungs daily as needed for wheezing or shortness of breath., Disp: , Rfl:    aspirin 81 MG tablet, Take 1 tablet (81 mg total) by mouth daily., Disp: 90 tablet, Rfl: 1   atorvastatin (LIPITOR) 40 MG tablet, Take 1 tablet (40 mg total) by mouth daily., Disp: 90 tablet, Rfl: 3   empagliflozin (JARDIANCE) 10 MG TABS tablet, Take 1 tablet (10 mg total) by mouth daily before breakfast., Disp: 30 tablet, Rfl: 5   fluticasone (FLONASE) 50 MCG/ACT nasal spray, Place 1 spray into both nostrils daily as needed for allergies., Disp: , Rfl:    metoprolol succinate (TOPROL-XL) 25 MG 24 hr tablet, Take 1 tablet (25 mg  total) by mouth daily., Disp: 1 tablet, Rfl: 0   omeprazole (PRILOSEC) 20 MG capsule, Take 20 mg by mouth daily., Disp: , Rfl:    sacubitril-valsartan (ENTRESTO) 24-26 MG, Take 1 tablet by mouth 2 (two) times daily., Disp: 180 tablet, Rfl: 3   Cardiovascular & other pertient studies:  Reviewed external labs and tests, independently interpreted  EKG 09/07/2022: Probable sinus rhythm 58 bpm Inferior infarct -age undetermined   Coronary angiography 02/14/2022: LM: Normal LAD: Prox and mid medial calcification with mid focal 30% stenosis Lcx: Small, normal RCA: Mid 100% occlusion         Faint left-to-right collaterals filling diffusely disease PDA and PLA vessels   Normal filling pressures   Well compensated cardiomyopathy, likely nonischemic. Continue GDMT for HFrEF If exertional dyspnea persists in spite of optimal treatment for HFrEF, could consider MRI to assess viability in chronically occluded RCA territory.    Echocardiogram 10/23/2021:  Left ventricle cavity is normal in size and wall thickness. Normal global  wall motion. Normal LV systolic function with visual EF 50-55%. Doppler  evidence of grade II (pseudonormal) diastolic dysfunction, elevated LAP.  Left atrial cavity is mildly dilated.  Trileaflet aortic valve. Mild aortic valve leaflet thickening. Trace  aortic valve stenosis.  Mild calcification of the mitral valve annulus. Mild to moderate mitral  regurgitation.  Mild tricuspid regurgitation.  Mild pulmonic regurgitation.  No evidence of pulmonary hypertension.  Previous study on 01/24/2021 reported LVEF 20-25%, mod LA dilatation.  Recent labs: 02/02/2022: Glucose 145, BUN/Cr 24/1.03. EGFR >60. Na/K 136/5.0. Albumin 3.4. Rest of the CMP normal H/H 14/41. MCV 92. Platelets 294   Review of Systems  Cardiovascular:  Positive for leg swelling (Occasional). Negative for chest pain, dyspnea on exertion, palpitations and syncope.       Vitals:   09/07/22 0818  09/07/22 0820  BP: (!) 114/57 127/75  Pulse: (!) 55 63  SpO2: 97% 96%    Body mass index is 28.77 kg/m. Filed Weights   09/07/22 0808  Weight: 189 lb 3.2 oz (85.8 kg)     Objective:   Physical Exam Vitals and nursing note reviewed.  Constitutional:      General: He is not in acute distress. Neck:     Vascular: No JVD.  Cardiovascular:     Rate and Rhythm: Normal rate and regular rhythm.     Heart sounds: Normal heart sounds. No murmur heard. Pulmonary:     Effort: Pulmonary effort is normal.     Breath sounds: Normal breath sounds. No wheezing or rales.  Musculoskeletal:     Right lower leg: No edema.     Left lower leg: No edema.             Visit diagnoses:   ICD-10-CM   1. HFrEF (heart failure with reduced ejection fraction) (HCC)  I50.20 EKG 12-Lead    PCV ECHOCARDIOGRAM COMPLETE    Pro b natriuretic peptide (BNP)[facility] [place])    2. Syncope and collapse  R55 LONG TERM MONITOR-LIVE TELEMETRY (3-14 DAYS)    Ambulatory referral to Neurology    3. Coronary artery disease involving native coronary artery of native heart without angina pectoris  I25.10 PCV MYOCARDIAL PERFUSION WO LEXISCAN    CBC    Comprehensive Metabolic Panel (CMET)    Lipid panel    4. Confusion  R41.0 Ambulatory referral to Neurology    5. Numbness  R20.0 MR BRAIN W WO CONTRAST       Orders Placed This Encounter  Procedures   MR BRAIN W WO CONTRAST   CBC   Comprehensive Metabolic Panel (CMET)   Lipid panel   Pro b natriuretic peptide (BNP)[facility] [facility])   Ambulatory referral to Neurology   PCV MYOCARDIAL PERFUSION WO LEXISCAN   LONG TERM MONITOR-LIVE TELEMETRY (3-14 DAYS)   EKG 12-Lead   PCV ECHOCARDIOGRAM COMPLETE     Medication changes this visit: Meds ordered this encounter  Medications   clopidogrel (PLAVIX) 75 MG tablet    Sig: Take 1 tablet (75 mg total) by mouth daily.    Dispense:  90 tablet    Refill:  3      Assessment & Recommendations:   72 y.o. Caucasian male with coronary artery disease, likely nonischemic cardiomyopathy, tobacco dependence, prior history of alcohol abuse.    Syncope: No clear history of cardiac symptoms prior to syncope.  His left-sided numbness is concerning for possible stroke/TIA.  In addition, he also has had confusion and possible memory lapses.  Suspecting recent TIA, however ordered MRI brain and started him on aspirin and Plavix, at least for now.  In addition, I also referred him to see neurology.  From cardiac standpoint placed him on 2-week cardiac telemetry to evaluate for any significant arrhythmia.  And for yearly workup with neurology standpoint and with external cardiac telemetry, consider placement of  loop recorder.   Chronic systolic heart failure:  RCA CTO but otherwise no significant CAD. Therefore, HFrEF likely nonischemic cardiomyopathy.  EF recovered to 50-55% (echocardiogram 10/2021). Continue metoprolol succinate, Entresto, currently not taking Jardiance. Check CBC, CMP, BNP, lipid panel.  CAD: RCA CTO. Continue Aspirin, statin, metoprolol succinate Given his exertional dyspnea symptoms without any heart failure signs, recommend exercise nuclear stress test.   F/u after above testing.    Time spent: 40 min    Nigel Mormon, MD Pager: [phone] Office: [phone]

## 2022-09-07 ENCOUNTER — Encounter: Payer: Self-pay | Admitting: Cardiology

## 2022-09-07 ENCOUNTER — Ambulatory Visit: Payer: Medicare (Managed Care) | Admitting: Cardiology

## 2022-09-07 ENCOUNTER — Ambulatory Visit: Payer: Medicare (Managed Care)

## 2022-09-07 VITALS — BP 127/75 | HR 63 | Ht 68.0 in | Wt 189.2 lb

## 2022-09-07 DIAGNOSIS — R200 Anesthesia of skin: Secondary | ICD-10-CM

## 2022-09-07 DIAGNOSIS — I5020 Unspecified systolic (congestive) heart failure: Secondary | ICD-10-CM

## 2022-09-07 DIAGNOSIS — I2510 Atherosclerotic heart disease of native coronary artery without angina pectoris: Secondary | ICD-10-CM

## 2022-09-07 DIAGNOSIS — R55 Syncope and collapse: Secondary | ICD-10-CM

## 2022-09-07 DIAGNOSIS — R410 Disorientation, unspecified: Secondary | ICD-10-CM | POA: Insufficient documentation

## 2022-09-07 MED ORDER — CLOPIDOGREL BISULFATE 75 MG PO TABS: 75.0000 mg | ORAL_TABLET | Freq: Every day | ORAL | 3 refills | Status: DC

## 2022-09-25 LAB — COMPREHENSIVE METABOLIC PANEL
ALT: 12 IU/L (ref 0–44)
AST: 19 IU/L (ref 0–40)
Albumin/Globulin Ratio: 1.6 (ref 1.2–2.2)
Albumin: 4.5 g/dL (ref 3.9–4.9)
Alkaline Phosphatase: 81 IU/L (ref 44–121)
BUN/Creatinine Ratio: 15 (ref 10–24)
BUN: 17 mg/dL (ref 8–27)
Bilirubin Total: 0.5 mg/dL (ref 0.0–1.2)
CO2: 25 mmol/L (ref 20–29)
Calcium: 9.7 mg/dL (ref 8.6–10.2)
Chloride: 102 mmol/L (ref 96–106)
Creatinine, Ser: 1.15 mg/dL (ref 0.76–1.27)
Globulin, Total: 2.8 g/dL (ref 1.5–4.5)
Glucose: 97 mg/dL (ref 70–99)
Potassium: 5.3 mmol/L — ABNORMAL HIGH (ref 3.5–5.2)
Sodium: 141 mmol/L (ref 134–144)
Total Protein: 7.3 g/dL (ref 6.0–8.5)
eGFR: 69 mL/min/{1.73_m2} (ref >59)

## 2022-09-25 LAB — CBC
Hematocrit: 45.5 % (ref 37.5–51.0)
Hemoglobin: 15.3 g/dL (ref 13.0–17.7)
MCH: 32.3 pg (ref 26.6–33.0)
MCHC: 33.6 g/dL (ref 31.5–35.7)
MCV: 96 fL (ref 79–97)
Platelets: 266 10*3/uL (ref 150–450)
RBC: 4.74 x10E6/uL (ref 4.14–5.80)
RDW: 11.4 % — ABNORMAL LOW (ref 11.6–15.4)
WBC: 8.6 10*3/uL (ref 3.4–10.8)

## 2022-09-25 LAB — LIPID PANEL
Chol/HDL Ratio: 4.1 ratio (ref 0.0–5.0)
Cholesterol, Total: 134 mg/dL (ref 100–199)
HDL: 33 mg/dL — ABNORMAL LOW (ref >39)
LDL Chol Calc (NIH): 72 mg/dL (ref 0–99)
Triglycerides: 170 mg/dL — ABNORMAL HIGH (ref 0–149)
VLDL Cholesterol Cal: 29 mg/dL (ref 5–40)

## 2022-09-25 LAB — PRO B NATRIURETIC PEPTIDE: NT-Pro BNP: 607 pg/mL — ABNORMAL HIGH (ref 0–376)

## 2022-10-01 ENCOUNTER — Other Ambulatory Visit: Payer: Medicare (Managed Care)

## 2022-10-04 ENCOUNTER — Other Ambulatory Visit: Payer: Medicare (Managed Care)

## 2022-10-08 ENCOUNTER — Ambulatory Visit: Payer: Medicare (Managed Care)

## 2022-10-08 DIAGNOSIS — I2510 Atherosclerotic heart disease of native coronary artery without angina pectoris: Secondary | ICD-10-CM

## 2022-10-16 ENCOUNTER — Ambulatory Visit: Payer: Medicare (Managed Care)

## 2022-10-16 DIAGNOSIS — I5020 Unspecified systolic (congestive) heart failure: Secondary | ICD-10-CM

## 2022-11-07 ENCOUNTER — Ambulatory Visit: Payer: Medicare (Managed Care) | Admitting: Cardiology

## 2022-11-29 ENCOUNTER — Encounter: Payer: Self-pay | Admitting: Cardiology

## 2022-11-29 ENCOUNTER — Other Ambulatory Visit: Payer: Self-pay

## 2022-11-29 ENCOUNTER — Ambulatory Visit: Payer: Medicare (Managed Care) | Admitting: Cardiology

## 2022-11-29 VITALS — BP 187/77 | HR 76 | Ht 68.0 in | Wt 188.0 lb

## 2022-11-29 DIAGNOSIS — I2510 Atherosclerotic heart disease of native coronary artery without angina pectoris: Secondary | ICD-10-CM

## 2022-11-29 DIAGNOSIS — I5020 Unspecified systolic (congestive) heart failure: Secondary | ICD-10-CM

## 2022-11-29 MED ORDER — ENTRESTO 24-26 MG PO TABS: 2.0000 | ORAL_TABLET | Freq: Two times a day (BID) | ORAL | 3 refills | Status: DC

## 2022-11-29 MED ORDER — EMPAGLIFLOZIN 10 MG PO TABS
10.0000 mg | ORAL_TABLET | Freq: Every day | ORAL | 3 refills | Status: DC
Start: 2022-11-29 — End: 2022-12-31

## 2022-11-29 NOTE — Progress Notes (Signed)
Follow up visit  Subjective:   Logan Green, male    DOB: 04/22/1951, 72 y.o.   MRN: [id number]    HPI  Chief Complaint  Patient presents with   HFrEF (heart failure with reduced ejection fraction) (HCC)   Follow-up    72 y.o. Caucasian male with hypertension, prediabetes, CAD with RCA CTO, HFrEF  Patient is doing well, denies chest pain, shortness of breath, palpitations, leg edema, orthopnea, PND, TIA/syncope. He has not had any recurrent syncope episodes. In February 2024, he had had one episode of syncope with possibly left sided numbness, but history around that episode remains rather unclear. He has taken Aspirin and plavix since then for 3 months. He did not undergo MRI brain that I had then recommended, but has not had any recurrent similar symptoms. He does report occasional word finding difficulty and confusion. Separately, he has had bilateral feet tingling. He has not seen a Neurologist.   Reviewed recent test results with the patient, details below.     Current Outpatient Medications:    acetaminophen (TYLENOL) 500 MG tablet, Take 1,000 mg by mouth every 6 (six) hours as needed for mild pain., Disp: , Rfl:    albuterol (VENTOLIN HFA) 108 (90 Base) MCG/ACT inhaler, Inhale 1 puff into the lungs daily as needed for wheezing or shortness of breath., Disp: , Rfl:    aspirin 81 MG tablet, Take 1 tablet (81 mg total) by mouth daily., Disp: 90 tablet, Rfl: 1   atorvastatin (LIPITOR) 40 MG tablet, Take 1 tablet (40 mg total) by mouth daily., Disp: 90 tablet, Rfl: 3   clopidogrel (PLAVIX) 75 MG tablet, Take 1 tablet (75 mg total) by mouth daily., Disp: 90 tablet, Rfl: 3   fluticasone (FLONASE) 50 MCG/ACT nasal spray, Place 1 spray into both nostrils daily as needed for allergies., Disp: , Rfl:    magnesium oxide (MAG-OX) 400 (240 Mg) MG tablet, Take 1 tablet by mouth daily., Disp: , Rfl:    meloxicam (MOBIC) 15 MG tablet, Take 15 mg by mouth daily., Disp: , Rfl:     metoprolol succinate (TOPROL-XL) 25 MG 24 hr tablet, Take 1 tablet (25 mg total) by mouth daily., Disp: 1 tablet, Rfl: 0   omeprazole (PRILOSEC) 20 MG capsule, Take 1 capsule by mouth daily., Disp: , Rfl:    sacubitril-valsartan (ENTRESTO) 24-26 MG, Take 1 tablet by mouth 2 (two) times daily., Disp: 180 tablet, Rfl: 3   Cardiovascular & other pertient studies:  Reviewed external labs and tests, independently interpreted  EKG 09/07/2022: Probable sinus rhythm 58 bpm Inferior infarct -age undetermined  Regadenoson  Nuclear stress test 10/08/2022: Nondiagnostic ECG stress due to pharmacologic stress. There is a fixed severe defect in the inferior region. There is a partially reversible severe defect in the lateral and apical regions.  Overall function is abnormal with regional wall motion abnormalities including inferior, inferoseptal akinesis and lateral hypokinesis. Stress LV EF: 29%.  High risk study in view of severe LV systolic dysfunction. No previous exam available for comparison.   Echocardiogram 10/16/2022:  Mildly depressed LV systolic function with visual EF 45-50%. Left  ventricle cavity is normal in size. Normal left ventricular wall  thickness. Hypokinetic global wall motion. Doppler evidence of grade I  (impaired) diastolic dysfunction, normal LAP. Calculated EF 41%.  Left atrial cavity is mildly dilated at 39.5 ml/m^2.  Structurally normal mitral valve.  Mild (Grade I) mitral regurgitation.  Structurally normal tricuspid valve.  Mild tricuspid regurgitation. RVSP  measures  30 mmHg.  Compared to 10/2021, EF is now reduced from 50-55% to 45-50%.   Mobile cardiac telemetry 14 days 09/07/2022 - 09/21/2022: Dominant rhythm: Sinus. HR 41-102 bpm. Avg HR 70 bpm, in sinus rhythm. 3 episodes of sinus pauses, longest at 3.7 sec noted at 6:31 PM. No reported symptoms noted.  37 episodes of atrial tachycardia, fastest at 176 bpm for 11 beats, longest for 16 beats at 102 bpm. 1.5% isolated  SVE, <1% couplet/triplets. 3 episodes of VT, fastest and longest at 154 bpm for 7 beats. <1% isolated VE, couplet/triplets. No atrial fibrillation/atrial flutter/VT/high grade AV block noted. 0 patient triggered events.     Coronary angiography 02/14/2022: LM: Normal LAD: Prox and mid medial calcification with mid focal 30% stenosis Lcx: Small, normal RCA: Mid 100% occlusion         Faint left-to-right collaterals filling diffusely disease PDA and PLA vessels   Normal filling pressures   Well compensated cardiomyopathy, likely nonischemic. Continue GDMT for HFrEF If exertional dyspnea persists in spite of optimal treatment for HFrEF, could consider MRI to assess viability in chronically occluded RCA territory.    Echocardiogram 10/23/2021:  Left ventricle cavity is normal in size and wall thickness. Normal global  wall motion. Normal LV systolic function with visual EF 50-55%. Doppler  evidence of grade II (pseudonormal) diastolic dysfunction, elevated LAP.  Left atrial cavity is mildly dilated.  Trileaflet aortic valve. Mild aortic valve leaflet thickening. Trace  aortic valve stenosis.  Mild calcification of the mitral valve annulus. Mild to moderate mitral  regurgitation.  Mild tricuspid regurgitation.  Mild pulmonic regurgitation.  No evidence of pulmonary hypertension.  Previous study on 01/24/2021 reported LVEF 20-25%, mod LA dilatation.  Recent labs: 09/24/2022: Glucose 97, BUN/Cr 17/1.15. EGFR 69. Na/K 141/5.3. Rest of the CMP normal NT proBNP 607 H/H 15/45. MCV 96. Platelets 266  02/02/2022: Glucose 145, BUN/Cr 24/1.03. EGFR >60. Na/K 136/5.0. Albumin 3.4. Rest of the CMP normal H/H 14/41. MCV 92. Platelets 294   Review of Systems  Cardiovascular:  Negative for chest pain, dyspnea on exertion, leg swelling, palpitations and syncope.       Vitals:   11/29/22 1155 11/29/22 1201  BP: (!) 177/79 (!) 187/77  Pulse: 74 76  SpO2: 98% 99%    Body mass index is  28.59 kg/m. Filed Weights   11/29/22 1155  Weight: 188 lb (85.3 kg)     Objective:   Physical Exam Vitals and nursing note reviewed.  Constitutional:      General: He is not in acute distress. Neck:     Vascular: No JVD.  Cardiovascular:     Rate and Rhythm: Normal rate and regular rhythm.     Heart sounds: Normal heart sounds. No murmur heard. Pulmonary:     Effort: Pulmonary effort is normal.     Breath sounds: Normal breath sounds. No wheezing or rales.  Musculoskeletal:     Right lower leg: No edema.     Left lower leg: No edema.             Visit diagnoses:   ICD-10-CM   1. HFrEF (heart failure with reduced ejection fraction) (HCC)  I50.20 Basic metabolic panel    2. Coronary artery disease involving native coronary artery of native heart without angina pectoris  I25.10        Orders Placed This Encounter  Procedures   Basic metabolic panel     Medication changes this visit: Meds ordered this encounter  Medications  empagliflozin (JARDIANCE) 10 MG TABS tablet    Sig: Take 1 tablet (10 mg total) by mouth daily before breakfast.    Dispense:  30 tablet    Refill:  3   sacubitril-valsartan (ENTRESTO) 24-26 MG    Sig: Take 2 tablets by mouth 2 (two) times daily.    Dispense:  180 tablet    Refill:  3    Prior auth approved 11/03/2021 through 07/15/2022.     Assessment & Recommendations:    72 y.o. Caucasian male with hypertension, prediabetes, CAD with RCA CTO, HFrEF, tobacco dependence, prior history of alcohol abuse.    Syncope: No recurrence. 3.7 sec pause on cardiac telemetry, but asymptomatic. Monitor for now. If recurrent symptoms, will consider implantable loop recorder. Unclear if he truly had a TIA. He has completed 3 months of DAPT and can now stop plavix. Consider Neurology referral given his spells of confusion, and separately, paresthesias.   Chronic systolic heart failure:  Likely combination of ischemic and nonischemic  cardiomyopathy. Minimal reversibility, if any, SPECT Imaging. 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 Given elevated blood pressure and proBNP, added Jardiance 10 mg daily and increased Entresto to 49-51 mg bid. Continue metoprolol succinate 25 mg daily. BMP in 1 week  CAD: RCA CTO. Continue Aspirin, statin, metoprolol succinate  F/u in 4 weeks    Elder Negus, MD Pager: [phone] Office: [phone]

## 2022-12-27 LAB — BASIC METABOLIC PANEL
BUN/Creatinine Ratio: 12 (ref 10–24)
BUN: 13 mg/dL (ref 8–27)
CO2: 24 mmol/L (ref 20–29)
Calcium: 9.6 mg/dL (ref 8.6–10.2)
Chloride: 102 mmol/L (ref 96–106)
Creatinine, Ser: 1.06 mg/dL (ref 0.76–1.27)
Glucose: 100 mg/dL — ABNORMAL HIGH (ref 70–99)
Potassium: 4.8 mmol/L (ref 3.5–5.2)
Sodium: 139 mmol/L (ref 134–144)
eGFR: 76 mL/min/{1.73_m2} (ref >59)

## 2022-12-31 ENCOUNTER — Encounter: Payer: Self-pay | Admitting: Cardiology

## 2022-12-31 ENCOUNTER — Ambulatory Visit: Payer: Medicare (Managed Care) | Admitting: Cardiology

## 2022-12-31 VITALS — BP 151/75 | HR 65 | Ht 68.0 in | Wt 184.0 lb

## 2022-12-31 DIAGNOSIS — I2510 Atherosclerotic heart disease of native coronary artery without angina pectoris: Secondary | ICD-10-CM

## 2022-12-31 DIAGNOSIS — I5020 Unspecified systolic (congestive) heart failure: Secondary | ICD-10-CM

## 2022-12-31 MED ORDER — SPIRONOLACTONE 25 MG PO TABS: 25.0000 mg | ORAL_TABLET | Freq: Every day | ORAL | 3 refills | Status: DC

## 2022-12-31 MED ORDER — METOPROLOL SUCCINATE ER 25 MG PO TB24: 25.0000 mg | ORAL_TABLET | Freq: Every day | ORAL | 3 refills | Status: DC

## 2022-12-31 MED ORDER — ENTRESTO 24-26 MG PO TABS: 1.0000 | ORAL_TABLET | Freq: Two times a day (BID) | ORAL | 3 refills | Status: DC

## 2022-12-31 NOTE — Progress Notes (Signed)
Follow up visit  Subjective:   Logan Green, male    DOB: 12/23/1950, 72 y.o.   MRN: [id number]    HPI  Chief Complaint  Patient presents with   HFrEF (heart failure with reduced ejection fraction) (HCC)   Follow-up    72 y.o. Caucasian male with hypertension, prediabetes, CAD with RCA CTO, HFrEF  Patient is here today with his significant other. He did not tolerate Jardiance, as it "made him feel weird", thus he stopped it. Also, he did not tolerate higher dose Entresto, as it causes "floaters in front of his eyes" and he could not even mow his lawn. With Entresto 24-26 mg bid, he is doing well. On a separate note, he has tingling in both his feet.    Current Outpatient Medications:    acetaminophen (TYLENOL) 500 MG tablet, Take 1,000 mg by mouth every 6 (six) hours as needed for mild pain., Disp: , Rfl:    albuterol (VENTOLIN HFA) 108 (90 Base) MCG/ACT inhaler, Inhale 1 puff into the lungs daily as needed for wheezing or shortness of breath., Disp: , Rfl:    aspirin 81 MG tablet, Take 1 tablet (81 mg total) by mouth daily., Disp: 90 tablet, Rfl: 1   atorvastatin (LIPITOR) 40 MG tablet, Take 1 tablet (40 mg total) by mouth daily., Disp: 90 tablet, Rfl: 3   empagliflozin (JARDIANCE) 10 MG TABS tablet, Take 1 tablet (10 mg total) by mouth daily before breakfast., Disp: 30 tablet, Rfl: 3   fluticasone (FLONASE) 50 MCG/ACT nasal spray, Place 1 spray into both nostrils daily as needed for allergies., Disp: , Rfl:    magnesium oxide (MAG-OX) 400 (240 Mg) MG tablet, Take 1 tablet by mouth daily., Disp: , Rfl:    meloxicam (MOBIC) 15 MG tablet, Take 15 mg by mouth daily., Disp: , Rfl:    metoprolol succinate (TOPROL-XL) 25 MG 24 hr tablet, Take 1 tablet (25 mg total) by mouth daily., Disp: 1 tablet, Rfl: 0   omeprazole (PRILOSEC) 20 MG capsule, Take 1 capsule by mouth daily., Disp: , Rfl:    sacubitril-valsartan (ENTRESTO) 24-26 MG, Take 2 tablets by mouth 2 (two) times daily., Disp:  180 tablet, Rfl: 3   Cardiovascular & other pertient studies:  Reviewed external labs and tests, independently interpreted  EKG 09/07/2022: Probable sinus rhythm 58 bpm Inferior infarct -age undetermined  Regadenoson  Nuclear stress test 10/08/2022: Nondiagnostic ECG stress due to pharmacologic stress. There is a fixed severe defect in the inferior region. There is a partially reversible severe defect in the lateral and apical regions.  Overall function is abnormal with regional wall motion abnormalities including inferior, inferoseptal akinesis and lateral hypokinesis. Stress LV EF: 29%.  High risk study in view of severe LV systolic dysfunction. No previous exam available for comparison.   Echocardiogram 10/16/2022:  Mildly depressed LV systolic function with visual EF 45-50%. Left  ventricle cavity is normal in size. Normal left ventricular wall  thickness. Hypokinetic global wall motion. Doppler evidence of grade I  (impaired) diastolic dysfunction, normal LAP. Calculated EF 41%.  Left atrial cavity is mildly dilated at 39.5 ml/m^2.  Structurally normal mitral valve.  Mild (Grade I) mitral regurgitation.  Structurally normal tricuspid valve.  Mild tricuspid regurgitation. RVSP  measures 30 mmHg.  Compared to 10/2021, EF is now reduced from 50-55% to 45-50%.   Mobile cardiac telemetry 14 days 09/07/2022 - 09/21/2022: Dominant rhythm: Sinus. HR 41-102 bpm. Avg HR 70 bpm, in sinus rhythm. 3 episodes of  sinus pauses, longest at 3.7 sec noted at 6:31 PM. No reported symptoms noted.  37 episodes of atrial tachycardia, fastest at 176 bpm for 11 beats, longest for 16 beats at 102 bpm. 1.5% isolated SVE, <1% couplet/triplets. 3 episodes of VT, fastest and longest at 154 bpm for 7 beats. <1% isolated VE, couplet/triplets. No atrial fibrillation/atrial flutter/VT/high grade AV block noted. 0 patient triggered events.     Coronary angiography 02/14/2022: LM: Normal LAD: Prox and mid medial  calcification with mid focal 30% stenosis Lcx: Small, normal RCA: Mid 100% occlusion         Faint left-to-right collaterals filling diffusely disease PDA and PLA vessels   Normal filling pressures   Well compensated cardiomyopathy, likely nonischemic. Continue GDMT for HFrEF If exertional dyspnea persists in spite of optimal treatment for HFrEF, could consider MRI to assess viability in chronically occluded RCA territory.    Echocardiogram 10/23/2021:  Left ventricle cavity is normal in size and wall thickness. Normal global  wall motion. Normal LV systolic function with visual EF 50-55%. Doppler  evidence of grade II (pseudonormal) diastolic dysfunction, elevated LAP.  Left atrial cavity is mildly dilated.  Trileaflet aortic valve. Mild aortic valve leaflet thickening. Trace  aortic valve stenosis.  Mild calcification of the mitral valve annulus. Mild to moderate mitral  regurgitation.  Mild tricuspid regurgitation.  Mild pulmonic regurgitation.  No evidence of pulmonary hypertension.  Previous study on 01/24/2021 reported LVEF 20-25%, mod LA dilatation.  Recent labs: March-June 2024: Glucose 100, BUN/Cr 13/1.06. EGFR 76. Na/K 139/4.8. Rest of the CMP normal H/H 15/45. MCV 96. Platelets 266 Chol 134, TG 170, HDL 33, LDL 72 NT proBNP 607   Review of Systems  Cardiovascular:  Negative for chest pain, dyspnea on exertion, leg swelling, palpitations and syncope.  Neurological:  Positive for paresthesias.       Vitals:   12/31/22 1321 12/31/22 1327  BP: (!) 159/77 (!) 151/75  Pulse: 68 65  SpO2: 96% 96%    There is no height or weight on file to calculate BMI. Filed Weights   12/31/22 1321  Weight: 184 lb (83.5 kg)     Objective:   Physical Exam Vitals and nursing note reviewed.  Constitutional:      General: He is not in acute distress. Neck:     Vascular: No JVD.  Cardiovascular:     Rate and Rhythm: Normal rate and regular rhythm.     Pulses:           Dorsalis pedis pulses are 2+ on the right side and 1+ on the left side.       Posterior tibial pulses are 1+ on the right side and 1+ on the left side.     Heart sounds: Normal heart sounds. No murmur heard. Pulmonary:     Effort: Pulmonary effort is normal.     Breath sounds: Normal breath sounds. No wheezing or rales.  Musculoskeletal:     Right lower leg: No edema.     Left lower leg: No edema.             Visit diagnoses:   ICD-10-CM   1. HFrEF (heart failure with reduced ejection fraction) (HCC)  I50.20 Basic metabolic panel    Pro b natriuretic peptide (BNP)[facility])    2. Coronary artery disease involving native coronary artery of native heart without angina pectoris  I25.10        Orders Placed This Encounter  Procedures  Basic metabolic panel   Pro b natriuretic peptide (BNP)[facility])     Medication changes this visit: Meds ordered this encounter  Medications   sacubitril-valsartan (ENTRESTO) 24-26 MG    Sig: Take 1 tablet by mouth 2 (two) times daily.    Dispense:  180 tablet    Refill:  3    Prior auth approved 11/03/2021 through 07/15/2022.   metoprolol succinate (TOPROL-XL) 25 MG 24 hr tablet    Sig: Take 1 tablet (25 mg total) by mouth daily.    Dispense:  90 tablet    Refill:  3   spironolactone (ALDACTONE) 25 MG tablet    Sig: Take 1 tablet (25 mg total) by mouth daily.    Dispense:  90 tablet    Refill:  3     Assessment & Recommendations:    72 y.o. Caucasian male with hypertension, prediabetes, CAD with RCA CTO, HFrEF, tobacco dependence, prior history of alcohol abuse.    Syncope: No recurrence. 3.7 sec pause on cardiac telemetry, but asymptomatic. Monitor for now. If recurrent symptoms, will consider implantable loop recorder. Unclear if he truly had a TIA. He has completed 3 months of DAPT and I stopped his Plavix (11/2022). Consider Neurology referral if he has recurrent spells of  confusion, and separately, paresthesias.   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 Minimal reversibility, if any, SPECT Imaging. 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 Did not tolerate Jardiance. Did not tolerate higher dose Entresto, continue 24-26 mg bid. Added spironolactone 25 mg daily. Check BMP, proBNP in 1 week.  CAD: RCA CTO. Continue Aspirin, statin, metoprolol succinate  Paresthesias: Consider workup for neuropathy  F/u in 3 months    Elder Negus, MD Pager: [phone] Office: [phone]

## 2023-01-12 LAB — BASIC METABOLIC PANEL
BUN/Creatinine Ratio: 13 (ref 10–24)
BUN: 14 mg/dL (ref 8–27)
CO2: 24 mmol/L (ref 20–29)
Calcium: 9.4 mg/dL (ref 8.6–10.2)
Chloride: 100 mmol/L (ref 96–106)
Creatinine, Ser: 1.09 mg/dL (ref 0.76–1.27)
Glucose: 111 mg/dL — ABNORMAL HIGH (ref 70–99)
Potassium: 4.5 mmol/L (ref 3.5–5.2)
Sodium: 138 mmol/L (ref 134–144)
eGFR: 73 mL/min/{1.73_m2} (ref >59)

## 2023-01-12 LAB — PRO B NATRIURETIC PEPTIDE: NT-Pro BNP: 769 pg/mL — ABNORMAL HIGH (ref 0–376)

## 2023-04-03 ENCOUNTER — Ambulatory Visit: Payer: Self-pay | Admitting: Cardiology

## 2023-04-11 ENCOUNTER — Ambulatory Visit: Payer: Self-pay | Admitting: Cardiology

## 2023-06-11 ENCOUNTER — Ambulatory Visit: Payer: Medicare (Managed Care) | Attending: Cardiology | Admitting: Cardiology

## 2023-06-11 ENCOUNTER — Encounter: Payer: Self-pay | Admitting: Cardiology

## 2023-06-11 ENCOUNTER — Telehealth: Payer: Self-pay | Admitting: Cardiology

## 2023-06-11 VITALS — BP 120/82 | HR 67 | Resp 16 | Ht 68.0 in | Wt 177.0 lb

## 2023-06-11 DIAGNOSIS — I5020 Unspecified systolic (congestive) heart failure: Secondary | ICD-10-CM

## 2023-06-11 DIAGNOSIS — I25118 Atherosclerotic heart disease of native coronary artery with other forms of angina pectoris: Secondary | ICD-10-CM | POA: Diagnosis not present

## 2023-06-11 MED ORDER — ATORVASTATIN CALCIUM 40 MG PO TABS: 40.0000 mg | ORAL_TABLET | Freq: Every day | ORAL | 3 refills | Status: DC

## 2023-06-11 MED ORDER — ENTRESTO 24-26 MG PO TABS: 1.0000 | ORAL_TABLET | Freq: Two times a day (BID) | ORAL | 3 refills | Status: DC

## 2023-06-11 MED ORDER — METOPROLOL SUCCINATE ER 25 MG PO TB24: 25.0000 mg | ORAL_TABLET | Freq: Every day | ORAL | 3 refills | Status: DC

## 2023-06-11 MED ORDER — SPIRONOLACTONE 25 MG PO TABS: 25.0000 mg | ORAL_TABLET | Freq: Every day | ORAL | 3 refills | Status: DC

## 2023-06-11 NOTE — Patient Instructions (Signed)
Medication Instructions:   Your physician recommends that you continue on your current medications as directed. Please refer to the Current Medication list given to you today.  *If you need a refill on your cardiac medications before your next appointment, please call your pharmacy*     Follow-Up: At [facility], you and your health needs are our priority.  As part of our continuing mission to provide you with exceptional heart care, we have created designated Provider Care Teams.  These Care Teams include your primary Cardiologist (physician) and Advanced Practice Providers (APPs -  Physician Assistants and Nurse Practitioners) who all work together to provide you with the care you need, when you need it.  We recommend signing up for the patient portal called "MyChart".  Sign up information is provided on this After Visit Summary.  MyChart is used to connect with patients for Virtual Visits (Telemedicine).  Patients are able to view lab/test results, encounter notes, upcoming appointments, etc.  Non-urgent messages can be sent to your provider as well.   To learn more about what you can do with MyChart, go to [email or web address].    Your next appointment:   6 month(s)  Provider:   Dr. Rosemary Holms

## 2023-06-11 NOTE — Progress Notes (Signed)
Cardiology Office Note:  .   Date:  06/11/2023  ID:  Logan Green, DOB [birth year], MRN [id number] PCP: Jackie Plum, MD  [facility] Providers Cardiologist:  Truett Mainland, MD PCP: Jackie Plum, MD  Chief Complaint  Patient presents with   HFrEF (heart failure with reduced ejection fraction) (HCC)   Follow-up      History of Present Illness: .    Logan Green is a 72 y.o. male with hypertension, prediabetes, CAD with RCA CTO, HFrEF   Patient is doing well, denies any complaints of chest pain or shortness of breath.  He is compliant with his medical therapy.  Vitals:   06/11/23 1325  BP: 120/82  Pulse: 67  Resp: 16  SpO2: 97%     ROS:  Review of Systems  Cardiovascular:  Negative for chest pain, dyspnea on exertion, leg swelling, palpitations and syncope.     Studies Reviewed: Marland Kitchen        EKG 06/11/2023: Sinus bradycardia Inferior infarct , age undetermined Possible Anterior infarct , age undetermined No previous ECGs available  Independently interpreted Labs 12/2022: Cr 1.09 Labs 09/2022: Chol 134, TG 170, HDL 33, LDL 72 Hb 15.3   Physical Exam:   Physical Exam Vitals and nursing note reviewed.  Constitutional:      General: He is not in acute distress. Neck:     Vascular: No JVD.  Cardiovascular:     Rate and Rhythm: Normal rate and regular rhythm.     Heart sounds: Normal heart sounds. No murmur heard. Pulmonary:     Effort: Pulmonary effort is normal.     Breath sounds: Normal breath sounds. No wheezing or rales.  Musculoskeletal:     Right lower leg: No edema.     Left lower leg: No edema.      VISIT DIAGNOSES: No diagnosis found.   ASSESSMENT AND PLAN: .    Logan Green is a 72 y.o. male with hypertension, prediabetes, CAD with RCA CTO, HFrEF, tobacco dependence, prior history of alcohol abuse.     Chronic systolic heart failure:  Likely combination of ischemic and nonischemic  cardiomyopathy. Minimal reversibility, if any, SPECT Imaging. 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 Did not tolerate Jardiance. Did not tolerate higher dose Entresto, continue 24-26 mg bid. Continue spironolactone 25 mg daily. Refills sent.   Syncope: No recurrence. 3.7 sec pause on cardiac telemetry, but asymptomatic. Monitor for now. If recurrent symptoms, will consider implantable loop recorder. Unclear if he truly had a TIA. He has completed 3 months of DAPT and I stopped his Plavix (11/2022). Consider Neurology referral if he has recurrent spells of confusion, and separately, paresthesias.      Meds ordered this encounter  Medications   spironolactone (ALDACTONE) 25 MG tablet    Sig: Take 1 tablet (25 mg total) by mouth daily.    Dispense:  90 tablet    Refill:  3   sacubitril-valsartan (ENTRESTO) 24-26 MG    Sig: Take 1 tablet by mouth 2 (two) times daily.    Dispense:  180 tablet    Refill:  3    Prior auth approved 11/03/2021 through 07/15/2022.   metoprolol succinate (TOPROL-XL) 25 MG 24 hr tablet    Sig: Take 1 tablet (25 mg total) by mouth daily.    Dispense:  90 tablet    Refill:  3   atorvastatin (LIPITOR) 40 MG tablet    Sig: Take 1 tablet (40 mg total) by mouth daily.  Dispense:  90 tablet    Refill:  3     F/u in 6 months  Signed, Elder Negus, MD

## 2023-06-11 NOTE — Telephone Encounter (Signed)
Paper Work Dropped Off: [employer] patient assistance  Date:06/11/23  Location of paper:  Dr Nature conservation officer

## 2023-06-11 NOTE — Telephone Encounter (Signed)
Pt assistance forms received.   Provider page signed and dated by Dr. Rosemary Holms.   Will scan all forms to [facility] [employer]'s email, for further management of pt assistance forms.

## 2023-06-12 ENCOUNTER — Telehealth: Payer: Self-pay

## 2023-06-12 NOTE — Telephone Encounter (Signed)
Received documents, reviewing

## 2023-06-14 ENCOUNTER — Other Ambulatory Visit (HOSPITAL_COMMUNITY): Payer: Self-pay

## 2023-06-14 NOTE — Telephone Encounter (Signed)
PAP: Application for Sherryll Burger has been submitted to PAP Companies: [employer], via fax If update is requested in the meantime please refer to [employer] at [phone].

## 2023-06-17 ENCOUNTER — Encounter: Payer: Self-pay | Admitting: Cardiology

## 2023-06-17 NOTE — Telephone Encounter (Signed)
Called the pt and wife about entresto denial from pt assistance.  Per both parties, they just found out that the pt just got approved for Medicaid, and the Sherryll Burger will get covered under that for $4 a month.   Per the pt and wife, they would like to keep him on Entresto given the approval for Medicaid, and they will provide his new card for [place] to upload in his chart, when he comes in for his next office visit appt.  Both parties were very gracious for all the assistance provided, and the pt will continue his current Entresto regimen.   Will update Dr. Rosemary Holms on the good news.

## 2023-06-17 NOTE — Telephone Encounter (Signed)
Left the pt a message to call the office back to further discuss recommendations per Dr. Rosemary Holms, as discussed in this encounter.

## 2023-06-17 NOTE — Telephone Encounter (Signed)
Fantastic. Continue Entresto.  Thanks MJP

## 2023-06-17 NOTE — Telephone Encounter (Signed)
It appears that it is denied for nonmedical reasons, purely related to patient's income.  Since I do not foresee that changing, unless patient provides exertion documents, we can change Entresto to losartan 25 mg daily.  Thanks MJP

## 2023-06-17 NOTE — Telephone Encounter (Signed)
PAP: Patient has been denied for pt assistance by PAP Companies: [employer] due to NEEDS TO BE DENIED FOR [place] FIRST. Indexing full denial to chart media. Letter has been sent to patient.

## 2023-06-17 NOTE — Telephone Encounter (Signed)
Will forward this message to our Pharmacist and Dr. Rosemary Holms, to further advise on this matter.   Per Med Tech with pt assistance, the pts Entresto assistance is being denied for the reasons mentioned in this encounter.   Will follow-up with the pt accordingly thereafter, once Dr. Rosemary Holms and Pharmacy further advise.

## 2023-06-17 NOTE — Telephone Encounter (Signed)
Logan Green, Logan Bene, MD  [name], LPN Caller: Unspecified (5 days ago, 12:40 PM) Fantastic. Continue Entresto.  Thanks MJP

## 2023-08-31 ENCOUNTER — Other Ambulatory Visit: Payer: Self-pay | Admitting: Cardiology

## 2023-10-07 ENCOUNTER — Other Ambulatory Visit: Payer: Self-pay | Admitting: Cardiology

## 2024-02-05 ENCOUNTER — Encounter: Payer: Self-pay | Admitting: Cardiology

## 2024-02-05 ENCOUNTER — Other Ambulatory Visit (HOSPITAL_COMMUNITY): Payer: Self-pay

## 2024-02-05 ENCOUNTER — Telehealth: Payer: Self-pay | Admitting: Cardiology

## 2024-02-05 ENCOUNTER — Ambulatory Visit: Payer: Medicare (Managed Care) | Attending: Cardiovascular Disease | Admitting: Cardiology

## 2024-02-05 VITALS — BP 110/66 | HR 66 | Ht 68.0 in | Wt 183.2 lb

## 2024-02-05 DIAGNOSIS — I25118 Atherosclerotic heart disease of native coronary artery with other forms of angina pectoris: Secondary | ICD-10-CM

## 2024-02-05 DIAGNOSIS — M79605 Pain in left leg: Secondary | ICD-10-CM

## 2024-02-05 DIAGNOSIS — I5020 Unspecified systolic (congestive) heart failure: Secondary | ICD-10-CM | POA: Diagnosis not present

## 2024-02-05 DIAGNOSIS — M79604 Pain in right leg: Secondary | ICD-10-CM

## 2024-02-05 MED ORDER — SACUBITRIL-VALSARTAN 49-51 MG PO TABS
1.0000 | ORAL_TABLET | Freq: Two times a day (BID) | ORAL | 3 refills | Status: DC
  Filled 2024-02-05: qty 60, 30d supply, fill #0

## 2024-02-05 NOTE — Progress Notes (Signed)
 Cardiology Office Note:  .   Date:  02/05/2024  ID:  NEIZAN DEBRUHL, DOB 04/24/1951, MRN [id number] PCP: Catalina Bare, MD  [facility] Providers Cardiologist:  Newman Lawrence, MD PCP: Catalina Bare, MD  Chief Complaint  Patient presents with   HFrEF (heart failure with reduced ejection fraction)      History of Present Illness: .    BLANCA THORNTON is a 73 y.o. male with hypertension, prediabetes, CAD with RCA CTO, HFrEF   Patient is doing well, denies any complaints of chest pain or shortness of breath.  He has noticed he has had pain in bilateral nipples while on spironolactone . Few weeks ago, he was noted to have elevated creatinine at 1.6, and potassium at 5.4.  Therefore, his PCP had asked him to hold spironolactone  and Entresto  for 2 weeks.  Follow BMP showed creatinine 1.05, and potassium of 4.9.   Lisinopril , patient has complaints of lipsmacking and tongue sticking out movements, which are involuntary.  Vitals:   02/05/24 1350  BP: 110/66  Pulse: 66  SpO2: 96%      ROS:  Review of Systems  Cardiovascular:  Negative for chest pain, dyspnea on exertion, leg swelling, palpitations and syncope.     Studies Reviewed: SABRA        EKG 06/11/2023: Sinus bradycardia Inferior infarct , age undetermined Possible Anterior infarct , age undetermined No previous ECGs available  Independently interpreted 01/29/2024: Cr 1.05, K 4.9  01/2024: Chol 109, TG 93, HDL 43, LDL 48 HbA1C 5.5% Cr 1.64, K 5.4 Mg 1.6  Labs 12/2022: Cr 1.09 Labs 09/2022: Chol 134, TG 170, HDL 33, LDL 72 Hb 15.3  Echocardiogram 10/2022:  Mildly depressed LV systolic function with visual EF 45-50%. Left  ventricle cavity is normal in size. Normal left ventricular wall  thickness. Hypokinetic global wall motion. Doppler evidence of grade I  (impaired) diastolic dysfunction, normal LAP. Calculated EF 41%.  Left atrial cavity is mildly dilated at 39.5 ml/m^2.   Structurally normal mitral valve.  Mild (Grade I) mitral regurgitation.  Structurally normal tricuspid valve.  Mild tricuspid regurgitation. RVSP  measures 30 mmHg.  Compared to 10/2021, EF is now reduced from 50-55% to 45-50%.   Coronary angiography 2022: LM: Normal LAD: Prox and mid medial calcification with mid focal 30% stenosis Lcx: Small, normal RCA: Mid 100% occlusion         Faint left-to-right collaterals filling diffusely disease PDA and PLA vessels   Normal filling pressures   Well compensated cardiomyopathy, likely nonischemic. Continue GDMT for HFrEF If exertional dyspnea persists in spite of optimal treatment for HFrEF, could consider MRI to assess viability in chronically occluded RCA territory.    Physical Exam:   Physical Exam Vitals and nursing note reviewed.  Constitutional:      General: He is not in acute distress. Neck:     Vascular: No JVD.  Cardiovascular:     Rate and Rhythm: Normal rate and regular rhythm.     Heart sounds: Normal heart sounds. No murmur heard. Pulmonary:     Effort: Pulmonary effort is normal.     Breath sounds: Normal breath sounds. No wheezing or rales.  Musculoskeletal:     Right lower leg: No edema.     Left lower leg: No edema.      VISIT DIAGNOSES:   ICD-10-CM   1. Coronary artery disease of native artery of native heart with stable angina pectoris (HCC)  I25.118     2. HFrEF (  heart failure with reduced ejection fraction) (HCC)  I50.20        ASSESSMENT AND PLAN: .    CLAY SOLUM is a 73 y.o. male with hypertension, prediabetes, CAD with RCA CTO, HFrEF, tobacco dependence, prior history of alcohol abuse.     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 Minimal reversibility, if any, on SPECT Imaging. 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 Did not tolerate Jardiance . Likely gynecomastia with spironolactone , therefore stopped it today.  This will also  help his potassium levels. Increase Entresto  from 24/26 mg twice daily to 49-51 mg twice daily. Check BMP and proBNP in 1 week. Continue metoprolol  succinate 25 mg daily. Check echocardiogram. Follow-up with APP in 4 weeks or Pharm.D. to see if uptitration of Entresto  and/or reinitiation of SGLT2 inhibitor is possible.  CAD: No angina symptoms.  Continue aspirin , statin.  Lipids are well-controlled.  Syncope: No recurrence. 3.7 sec pause on cardiac telemetry, but asymptomatic.  Involuntary lipsmacking and tongue sticking movements appear to be tics.  Consider neurology consultation.    Meds ordered this encounter  Medications   sacubitril -valsartan  (ENTRESTO ) 49-51 MG    Sig: Take 1 tablet by mouth 2 (two) times daily.    Dispense:  60 tablet    Refill:  3       F/u in 6 months  Signed, Newman [name] Lawrence, MD

## 2024-02-05 NOTE — Telephone Encounter (Signed)
 Per Patwardhan, can schedule pt w/ APP for same day as echo for pt please. ** Did not see any openings for 4-6 week follow up. Lvm on 02/05/2024 @2 :54pm.

## 2024-02-05 NOTE — Patient Instructions (Signed)
 Medication Instructions:   STOP Entresto  24-26 mg and Spironolactone   START Entresto  49-51 mg twice daily  *If you need a refill on your cardiac medications before your next appointment, please call your pharmacy*  Lab Work: In 1-2 weeks BMP and ProBNP  If you have labs (blood work) drawn today and your tests are completely normal, you will receive your results only by: MyChart Message (if you have MyChart) OR A paper copy in the mail If you have any lab test that is abnormal or we need to change your treatment, we will call you to review the results.  Testing/Procedures:  Your physician has requested that you have an echocardiogram in 1-2 weeks. Echocardiography is a painless test that uses sound waves to create images of your heart. It provides your doctor with information about the size and shape of your heart and how well your heart's chambers and valves are working. This procedure takes approximately one hour. There are no restrictions for this procedure. Please do NOT wear cologne, perfume, aftershave, or lotions (deodorant is allowed). Please arrive 15 minutes prior to your appointment time.  Please note: We ask at that you not bring children with you during ultrasound (echo/ vascular) testing. Due to room size and safety concerns, children are not allowed in the ultrasound rooms during exams. Our front office staff cannot provide observation of children in our lobby area while testing is being conducted. An adult accompanying a patient to their appointment will only be allowed in the ultrasound room at the discretion of the ultrasound technician under special circumstances. We apologize for any inconvenience.   2. Your physician has requested that you have an ankle brachial index (ABI). During this test an ultrasound and blood pressure cuff are used to evaluate the arteries that supply the arms and legs with blood. Allow thirty minutes for this exam. There are no restrictions or  special instructions.  Please note: We ask at that you not bring children with you during ultrasound (echo/ vascular) testing. Due to room size and safety concerns, children are not allowed in the ultrasound rooms during exams. Our front office staff cannot provide observation of children in our lobby area while testing is being conducted. An adult accompanying a patient to their appointment will only be allowed in the ultrasound room at the discretion of the ultrasound technician under special circumstances. We apologize for any inconvenience.   Follow-Up: At [facility], you and your health needs are our priority.  As part of our continuing mission to provide you with exceptional heart care, our providers are all part of one team.  This team includes your primary Cardiologist (physician) and Advanced Practice Providers or APPs (Physician Assistants and Nurse Practitioners) who all work together to provide you with the care you need, when you need it.  Your next appointment:   4- 6 week(s)  Provider:   One of our Advanced Practice Providers (APPs): Morse Clause, PA-C  Lamarr Satterfield, NP Miriam Shams, NP  Olivia Pavy, PA-C Josefa Beauvais, NP  Leontine Salen, PA-C Orren Fabry, PA-C  [place], [place] Jackee Alberts, NP  Damien Braver, NP Jon Hails, PA-C  Waddell Donath, PA-C    Dayna Dunn, PA-C  Glendia Ferrier, PA-C Lum Louis, NP Katlyn West, NP Callie Goodrich, PA-C  Evan Williams, PA-C Sheng Haley, PA-C  Xika Zhao, NP Kathleen Johnson, PA-C

## 2024-02-27 LAB — PRO B NATRIURETIC PEPTIDE: NT-Pro BNP: 1593 pg/mL — ABNORMAL HIGH (ref 0–376)

## 2024-02-27 LAB — BASIC METABOLIC PANEL WITH GFR
BUN/Creatinine Ratio: 14 (ref 10–24)
BUN: 15 mg/dL (ref 8–27)
CO2: 24 mmol/L (ref 20–29)
Calcium: 9.6 mg/dL (ref 8.6–10.2)
Chloride: 102 mmol/L (ref 96–106)
Creatinine, Ser: 1.11 mg/dL (ref 0.76–1.27)
Glucose: 84 mg/dL (ref 70–99)
Potassium: 5.5 mmol/L — ABNORMAL HIGH (ref 3.5–5.2)
Sodium: 142 mmol/L (ref 134–144)
eGFR: 71 mL/min/1.73 (ref >59)

## 2024-02-28 ENCOUNTER — Ambulatory Visit: Payer: Self-pay | Admitting: Cardiology

## 2024-02-28 DIAGNOSIS — I5020 Unspecified systolic (congestive) heart failure: Secondary | ICD-10-CM

## 2024-02-28 DIAGNOSIS — E875 Hyperkalemia: Secondary | ICD-10-CM

## 2024-02-28 MED ORDER — FUROSEMIDE 40 MG PO TABS: 40.0000 mg | ORAL_TABLET | Freq: Every day | ORAL | 3 refills | Status: DC

## 2024-02-28 NOTE — Progress Notes (Signed)
 Potassium is elevated at 5.5.  Marker of congestion is also elevated.  Okay to continue current medications, but add Lasix  40 mg daily.  Ordered at [facility] on [address] as requested. Spoke with the patient's spouse, he was napping.   Please order repeat BMP mid week next week before f/u w/me on 8/25.  Thanks MJP

## 2024-03-04 ENCOUNTER — Other Ambulatory Visit: Payer: Self-pay | Admitting: *Deleted

## 2024-03-04 DIAGNOSIS — I5020 Unspecified systolic (congestive) heart failure: Secondary | ICD-10-CM

## 2024-03-04 DIAGNOSIS — E875 Hyperkalemia: Secondary | ICD-10-CM

## 2024-03-05 ENCOUNTER — Ambulatory Visit: Payer: Self-pay | Admitting: Cardiology

## 2024-03-05 LAB — BASIC METABOLIC PANEL WITH GFR
BUN/Creatinine Ratio: 17 (ref 10–24)
BUN: 20 mg/dL (ref 8–27)
CO2: 26 mmol/L (ref 20–29)
Calcium: 9.7 mg/dL (ref 8.6–10.2)
Chloride: 98 mmol/L (ref 96–106)
Creatinine, Ser: 1.19 mg/dL (ref 0.76–1.27)
Glucose: 94 mg/dL (ref 70–99)
Potassium: 5.4 mmol/L — ABNORMAL HIGH (ref 3.5–5.2)
Sodium: 138 mmol/L (ref 134–144)
eGFR: 66 mL/min/1.73 (ref >59)

## 2024-03-06 ENCOUNTER — Ambulatory Visit (HOSPITAL_COMMUNITY)
Admission: RE | Admit: 2024-03-06 | Discharge: 2024-03-06 | Disposition: A | Discharge status: Home / Self Care | Payer: Medicare (Managed Care) | Source: Ambulatory Visit | Attending: Cardiology | Admitting: Cardiology

## 2024-03-06 DIAGNOSIS — M79605 Pain in left leg: Secondary | ICD-10-CM | POA: Insufficient documentation

## 2024-03-06 DIAGNOSIS — M79604 Pain in right leg: Secondary | ICD-10-CM | POA: Diagnosis not present

## 2024-03-06 DIAGNOSIS — I5020 Unspecified systolic (congestive) heart failure: Secondary | ICD-10-CM | POA: Diagnosis present

## 2024-03-06 LAB — ECHOCARDIOGRAM COMPLETE
AR max vel: 1.43 cm2
AV Area VTI: 1.33 cm2
AV Area mean vel: 1.32 cm2
AV Mean grad: 4 mmHg
AV Peak grad: 7.6 mmHg
Ao pk vel: 1.38 m/s
Area-P 1/2: 2.56 cm2
S' Lateral: 4.12 cm

## 2024-03-06 NOTE — Progress Notes (Signed)
 Cardiology Office Note:  .   Date:  03/06/2024  ID:  Logan Green, DOB October 20, 1950, MRN [id number] PCP: Catalina Bare, MD  [facility] Providers Cardiologist:  Newman Lawrence, MD PCP: Catalina Bare, MD  Chief Complaint  Patient presents with   Coronary artery disease of native artery of native heart wi   Hypertension        HFrEF      History of Present Illness: .    Logan Green is a 73 y.o. male with hypertension, prediabetes, CAD with RCA CTO, HFrEF   BMP and proBNP were checked at his last visit on 813/2025.  Potassium was elevated at 5.5, proBNP was elevated at 1593.  I added Lasix  40 mg daily and repeated BMP which showed potassium of 5.4  Patient is doing well, denies any overt dyspnea, orthopnea, PND symptoms.  Vitals:   03/09/24 1335  BP: (!) 90/52  Pulse: 74  SpO2: 94%       ROS:  Review of Systems  Cardiovascular:  Negative for chest pain, dyspnea on exertion, leg swelling, palpitations and syncope.     Studies Reviewed: SABRA        EKG 06/11/2023: Sinus bradycardia Inferior infarct , age undetermined Possible Anterior infarct , age undetermined No previous ECGs available  Echocardiogram 02/2024:  1. Left ventricular ejection fraction, by estimation, is 40 to 45%. The left ventricle has mildly decreased function. Left ventricular endocardial border not optimally defined to evaluate regional wall motion. Left ventricular diastolic parameters are  consistent with Grade I diastolic dysfunction (impaired relaxation).  2. Right ventricular systolic function is normal. The right ventricular size is normal.  3. The mitral valve is grossly normal. No evidence of mitral valve regurgitation. No evidence of mitral stenosis.  4. The aortic valve was not well visualized. Aortic valve regurgitation is not visualized. No aortic stenosis is present.  5. The inferior vena cava is normal in size with greater than 50% respiratory variability,  suggesting right atrial pressure of 3 mmHg.   Comparison(s): Similar to 2024 report.  Labs 02/2024: Cr 1.19, K 5.4 ProBNP 1593  01/29/2024: Cr 1.05, K 4.9  01/2024: Chol 109, TG 93, HDL 43, LDL 48 HbA1C 5.5% Cr 1.64, K 5.4 Mg 1.6  Labs 12/2022: Cr 1.09 Labs 09/2022: Chol 134, TG 170, HDL 33, LDL 72 Hb 15.3  Echocardiogram 10/2022:  Mildly depressed LV systolic function with visual EF 45-50%. Left  ventricle cavity is normal in size. Normal left ventricular wall  thickness. Hypokinetic global wall motion. Doppler evidence of grade I  (impaired) diastolic dysfunction, normal LAP. Calculated EF 41%.  Left atrial cavity is mildly dilated at 39.5 ml/m^2.  Structurally normal mitral valve.  Mild (Grade I) mitral regurgitation.  Structurally normal tricuspid valve.  Mild tricuspid regurgitation. RVSP  measures 30 mmHg.  Compared to 10/2021, EF is now reduced from 50-55% to 45-50%.   Coronary angiography 2022: LM: Normal LAD: Prox and mid medial calcification with mid focal 30% stenosis Lcx: Small, normal RCA: Mid 100% occlusion         Faint left-to-right collaterals filling diffusely disease PDA and PLA vessels   Normal filling pressures   Well compensated cardiomyopathy, likely nonischemic. Continue GDMT for HFrEF If exertional dyspnea persists in spite of optimal treatment for HFrEF, could consider MRI to assess viability in chronically occluded RCA territory.    Physical Exam:   Physical Exam Vitals and nursing note reviewed.  Constitutional:      General:  He is not in acute distress. Neck:     Vascular: No JVD.  Cardiovascular:     Rate and Rhythm: Normal rate and regular rhythm.     Heart sounds: Normal heart sounds. No murmur heard. Pulmonary:     Effort: Pulmonary effort is normal.     Breath sounds: Normal breath sounds. No wheezing or rales.  Musculoskeletal:     Right lower leg: No edema.     Left lower leg: No edema.      VISIT DIAGNOSES:    ICD-10-CM   1. HFrEF (heart failure with reduced ejection fraction) (HCC)  I50.20 Basic metabolic panel with GFR    Pro b natriuretic peptide (BNP)    Pro b natriuretic peptide (BNP)    Basic metabolic panel with GFR    2. Coronary artery disease of native artery of native heart with stable angina pectoris (HCC)  I25.118         ASSESSMENT AND PLAN: .    Logan Green is a 72 y.o. male with hypertension, prediabetes, CAD with RCA CTO, HFrEF, tobacco dependence, prior history of alcohol abuse.     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 Minimal reversibility, if any, on SPECT Imaging. 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 I do not think Jardiance  or spironolactone  can be reinitiated given his low blood pressures, and high potassium. Continue Entresto  49-50 mg twice daily, metoprolol  succinate 25 mg daily. Encourage liberal hydration. Repeat BMP, proBNP in 3-4 weeks. Follow-up in 4 to 6 weeks.  CAD: No angina symptoms.  Continue aspirin , statin.  Lipids are well-controlled.     No orders of the defined types were placed in this encounter.      F/u in 6 months  Signed, Newman [name] Lawrence, MD

## 2024-03-08 LAB — VAS US ABI WITH/WO TBI
Left ABI: 0.98
Right ABI: 1.02

## 2024-03-09 ENCOUNTER — Encounter: Payer: Self-pay | Admitting: Cardiology

## 2024-03-09 ENCOUNTER — Ambulatory Visit: Payer: Medicare (Managed Care) | Attending: Cardiology | Admitting: Cardiology

## 2024-03-09 VITALS — BP 90/52 | HR 74 | Ht 68.0 in | Wt 175.8 lb

## 2024-03-09 DIAGNOSIS — I25118 Atherosclerotic heart disease of native coronary artery with other forms of angina pectoris: Secondary | ICD-10-CM | POA: Diagnosis not present

## 2024-03-09 DIAGNOSIS — I5020 Unspecified systolic (congestive) heart failure: Secondary | ICD-10-CM | POA: Diagnosis not present

## 2024-03-09 MED ORDER — SACUBITRIL-VALSARTAN 49-51 MG PO TABS
1.0000 | ORAL_TABLET | Freq: Two times a day (BID) | ORAL | 3 refills | Status: AC
Start: 2024-03-09 — End: ?

## 2024-03-09 MED ORDER — FUROSEMIDE 40 MG PO TABS: 40.0000 mg | ORAL_TABLET | Freq: Every day | ORAL | 3 refills | Status: DC

## 2024-03-09 NOTE — Patient Instructions (Signed)
 Medication Instructions:  REFILLS SENT IN TODAY  *If you need a refill on your cardiac medications before your next appointment, please call your pharmacy*  Lab Work IN 3 TO 4 WEEKS: BMP PROBNP  If you have labs (blood work) drawn today and your tests are completely normal, you will receive your results only by: MyChart Message (if you have MyChart) OR A paper copy in the mail If you have any lab test that is abnormal or we need to change your treatment, we will call you to review the results.  Follow-Up: At [facility], you and your health needs are our priority.  As part of our continuing mission to provide you with exceptional heart care, our providers are all part of one team.  This team includes your primary Cardiologist (physician) and Advanced Practice Providers or APPs (Physician Assistants and Nurse Practitioners) who all work together to provide you with the care you need, when you need it.  Your next appointment:   6-8 WEEKS WITH APP  6 MONTHS WITH DR. PATWARDHAN   We recommend signing up for the patient portal called MyChart.  Sign up information is provided on this After Visit Summary.  MyChart is used to connect with patients for Virtual Visits (Telemedicine).  Patients are able to view lab/test results, encounter notes, upcoming appointments, etc.  Non-urgent messages can be sent to your provider as well.   To learn more about what you can do with MyChart, go to [email or web address].

## 2024-03-11 ENCOUNTER — Encounter: Payer: Self-pay | Admitting: Cardiology

## 2024-03-11 NOTE — Telephone Encounter (Signed)
 Okay to send nicotine  patches for diagnosis: Nicotine  dependence If smokes <1 pack/day, recommend 7 mg patch If smokes 1-2 pack/day, recommend 40 mg patch  60 days, 1 refill  Thanks MJP

## 2024-04-11 LAB — BASIC METABOLIC PANEL WITH GFR
BUN/Creatinine Ratio: 15 (ref 10–24)
BUN: 29 mg/dL — ABNORMAL HIGH (ref 8–27)
CO2: 24 mmol/L (ref 20–29)
Calcium: 9.7 mg/dL (ref 8.6–10.2)
Chloride: 97 mmol/L (ref 96–106)
Creatinine, Ser: 1.89 mg/dL — ABNORMAL HIGH (ref 0.76–1.27)
Glucose: 134 mg/dL — ABNORMAL HIGH (ref 70–99)
Potassium: 5.2 mmol/L (ref 3.5–5.2)
Sodium: 137 mmol/L (ref 134–144)
eGFR: 38 mL/min/1.73 — ABNORMAL LOW (ref >59)

## 2024-04-11 LAB — PRO B NATRIURETIC PEPTIDE: NT-Pro BNP: 884 pg/mL — ABNORMAL HIGH (ref 0–376)

## 2024-04-12 ENCOUNTER — Ambulatory Visit: Payer: Self-pay | Admitting: Cardiology

## 2024-04-12 DIAGNOSIS — I5020 Unspecified systolic (congestive) heart failure: Secondary | ICD-10-CM

## 2024-04-12 NOTE — Progress Notes (Signed)
 Marker of congestion, prpBNP much better, but Cr has increased from 1.1 to 1.89. Please ensure patient is drinking 2-2.5 L water everyday. Avoids NSAIDS. Take lasix  only in case of leg edema. Repeat BMP and proBNP in 7-10 days.  Thanks MJP

## 2024-04-13 MED ORDER — FUROSEMIDE 40 MG PO TABS: 40.0000 mg | ORAL_TABLET | Freq: Every day | ORAL | Status: AC | PRN

## 2024-04-28 LAB — BASIC METABOLIC PANEL WITH GFR
BUN/Creatinine Ratio: 8 — ABNORMAL LOW (ref 10–24)
BUN: 10 mg/dL (ref 8–27)
CO2: 22 mmol/L (ref 20–29)
Calcium: 8.5 mg/dL — ABNORMAL LOW (ref 8.6–10.2)
Chloride: 101 mmol/L (ref 96–106)
Creatinine, Ser: 1.32 mg/dL — ABNORMAL HIGH (ref 0.76–1.27)
Glucose: 107 mg/dL — ABNORMAL HIGH (ref 70–99)
Potassium: 4.1 mmol/L (ref 3.5–5.2)
Sodium: 137 mmol/L (ref 134–144)
eGFR: 58 mL/min/1.73 — ABNORMAL LOW (ref >59)

## 2024-04-28 LAB — PRO B NATRIURETIC PEPTIDE: NT-Pro BNP: 6407 pg/mL — ABNORMAL HIGH (ref 0–376)

## 2024-04-28 NOTE — Progress Notes (Signed)
 Hard to strike the right balance between controlling congestion and avoiding AKI. Cr better but proBNP is much higher. She has an appt with Dayna next week. Consider going up on diuretics with close monitoring of Cr, if patient clinically appears volume overloaded. Eventually, may need to seen heart failure clinic for closer monitoring.  Thanks MJP

## 2024-05-04 NOTE — Progress Notes (Unsigned)
 Cardiology Office Note:    Date:  05/05/2024   ID:  Logan Green, DOB 17-Dec-1950, MRN [id number]  PCP:  Catalina Bare, MD   [facility] Providers Cardiologist:  Newman [name] Lawrence, MD     Referring MD: Catalina Bare, MD   Chief complaint: 10-month follow-up of HFrEF and CAD     History of Present Illness:   Logan Green is a 73 y.o. male with a hx of chronic HFrEF, CAD s/p PCI CTO of RCA, HTN, HLD, syncope, ? TIA, tobacco dependence, alcohol use, prediabetes presenting to the office for 38-month follow-up of HFrEF.  Seen by Dr. Lawrence 01/20/2021 following mild troponin elevation and new onset CHF with bilateral leg swelling, DOE with minimal exertion, orthopnea.  Had not previously been seen by a physician for years. Echocardiogram 01/24/2021: LV moderately dilated, severe global hypokinesis with basal anteroseptal, anterior akinesis.  LVEF 20-25%, grade 3 restrictive diastolic dysfunction, elevated LAP.  RV mildly dilated, moderately reduced RV function, left atrial cavity moderately dilated.  Mild-moderate mitral regurgitation, mild-moderate tricuspid regurgitation.  Elevated PASP 30 mmHg. 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  Right left heart catheterization ordered. R/LHC 02/14/2021: RCA CTO at the midportion with faint left-to-right collaterals filling diffusely at the PDA/PLA vessels.  Echo 10/23/2021 showed improved EF to 50-55%.   OV 09/07/2022 patient appeared confused about events.  Spouse was providing most of the history.  Reported an episode of left-sided numbness followed by LOC, did not recall CP/SOB/palpitations around the episode.  Patient did report exertional dyspnea.  Cardiac monitoring, echo, stress test, with referral to neurology placed during this visit. Was transiently put on Plavix  at that time (later discontinued).   Cardiac event monitoring on 10/04/2022: Predominantly sinus rhythm, HR  41-102 bpm, average 70 bpm, sinus rhythm.  3 episodes of sinus pauses, longest at 3.7 seconds.  37 episodes of atrial tachycardia, fastest at 176 bpm X11 beats, longest 16 beats at 102 bpm.  1.5% isolated SVE.  3 episodes of VT, fastest and longest at 144 bpm for 7 beats.  No atrial fibrillation/atrial flutter/VT/high-grade AV block noted.  0 patient triggered events.  Myocardial perfusion study with Lexiscan  performed 10/08/2022: Fixed severe defect in the inferior region, partially reversible severe defect in the lateral and apical regions.  Overall function is abnormal with regional wall motion is including inferior, inferoseptal akinesis, and lateral hypokinesis.  High risk study in view of severe LV systolic dysfunction, EF 29%. Echocardiogram 10/16/2022: EF 45-50%, mildly depressed LV systolic function, hypokinetic global wall motion, G1 DD, normal LAP.  LA cavity mildly dilated, structurally normal mitral valve, grade 1 mitral regurgitation, structurally normal tricuspid valve, mild tricuspid regurgitation, RVSP measures 30 mmHg.  OV 02/05/2024 patient states his PCP had requested he hold spironolactone  and Entresto  X 2 weeks for elevated creatinine at 1.6 and potassium of 5.4.  Echocardiogram ordered, performed 03/06/2024: LVEF 40-45%, mildly decreased LV function, not optimal conditions to evaluate RWMA, G1 DD.  Normal LV function, grossly normal mitral valve.  No AV stenosis present.  RA pressure 3 mmHg.  Most recent cardiology OV 03/09/2024 patient was found to have elevated potassium with proBNP elevation, Lasix  40 mg daily was ordered, with repeat labs.  Improvement to proBNP from 1593 to 884, with increase in serum creatinine from 1.1 to 1.89.  Lasix  was decreased to take only as needed for leg edema, repeat labs ordered showing improvement in creatinine and worsening proBNP. Of note, previously did not  tolerate higher doses of Entresto  due to seeing floaters and SGLT2i due to feeling off.  Presents  to the clinic today with his ex-wife, cardiovascular condition appears guarded.  She states he has been excessively sleepy, increasingly so over the last 2 weeks.  Has slept all day last 2 days, not eaten much over the last 2 days.  Reports she found him at some point over the last week extremely pale.  Patient has lingual dyskinesia baseline, she states he appears more confused than normal.  Reports recent evidence of absence seizure like activity with brief periods of nonresponsiveness, continuation of lingual dyskinesia lasting minutes at a time. Last episode was within the last week. On return of responsiveness, patient appeared acutely confused to his surroundings.  These episodes are increasing in frequency over the last month.  Denies history of neuroimaging, was referred to neurology at prior cardiac OV years ago, patient states insurance would not pay for it at that time.  Denies chest pain.  Reports shortness of breath on exertion, occasionally at rest.  History of COPD, half pack per day-1 pack per day smoker since he was 73 years old.  Reports occasional palpitations.  Used the Lasix  3-4 days last week to help reduce swelling in his ankles.  Denies near-syncope, orthopnea, dark/tarry/bloody stools, hematuria.  ROS:   Please see the history of present illness.     All other systems reviewed and are negative.     Past Medical History:  Diagnosis Date   COPD (chronic obstructive pulmonary disease) (HCC)    Hypertension    Pneumonia    Rheumatic fever     Past Surgical History:  Procedure Laterality Date   CARDIAC CATHETERIZATION     I & D EXTREMITY Left 02/01/2022   Procedure: IRRIGATION AND DEBRIDEMENT OF THUMB;  Surgeon: Murrell Drivers, MD;  Location: [facility] OR;  Service: Orthopedics;  Laterality: Left;   RIGHT/LEFT HEART CATH AND CORONARY ANGIOGRAPHY N/A 02/14/2021   Procedure: RIGHT/LEFT HEART CATH AND CORONARY ANGIOGRAPHY;  Surgeon: Elmira Newman PARAS, MD;  Location: [facility] INVASIVE CV  LAB;  Service: Cardiovascular;  Laterality: N/A;    Current Medications: Current Meds  Medication Sig   acetaminophen  (TYLENOL ) 500 MG tablet Take 1,000 mg by mouth every 6 (six) hours as needed for mild pain.   albuterol (VENTOLIN HFA) 108 (90 Base) MCG/ACT inhaler Inhale 1 puff into the lungs daily as needed for wheezing or shortness of breath.   aspirin  81 MG tablet Take 1 tablet (81 mg total) by mouth daily.   atorvastatin  (LIPITOR) 40 MG tablet Take 1 tablet (40 mg total) by mouth daily.   budesonide-formoterol (SYMBICORT) 160-4.5 MCG/ACT inhaler Inhale 2 puffs into the lungs.   fluticasone (FLONASE) 50 MCG/ACT nasal spray Place 1 spray into both nostrils daily as needed for allergies.   furosemide  (LASIX ) 40 MG tablet Take 1 tablet (40 mg total) by mouth daily as needed for edema.   gabapentin (NEURONTIN) 300 MG capsule TAKE 1 CAPSULE(300 MG) BY MOUTH TWICE DAILY AS NEEDED FOR NERVE PAIN (Patient taking differently: Take 300 mg by mouth 2 (two) times daily.)   magnesium oxide (MAG-OX) 400 (240 Mg) MG tablet Take 1 tablet by mouth daily.   metoprolol  succinate (TOPROL -XL) 25 MG 24 hr tablet Take 1 tablet (25 mg total) by mouth daily.   omeprazole  (PRILOSEC) 20 MG capsule Take 1 capsule by mouth daily.   sacubitril -valsartan  (ENTRESTO ) 49-51 MG Take 1 tablet by mouth 2 (two) times daily.   spironolactone  (  ALDACTONE ) 25 MG tablet Take 25 mg by mouth daily.     Allergies:   Patient has no known allergies.   Social History   Socioeconomic History   Marital status: Legally Separated    Spouse name: Not on file   Number of children: 3   Years of education: Not on file   Highest education level: Not on file  Occupational History   Not on file  Tobacco Use   Smoking status: Every Day    Current packs/day: 0.50    Average packs/day: 0.5 packs/day for 41.0 years (20.5 ttl pk-yrs)    Types: Cigarettes   Smokeless tobacco: Never  Vaping Use   Vaping status: Never Used  Substance and  Sexual Activity   Alcohol use: Yes    Comment: occ   Drug use: Yes    Types: Marijuana   Sexual activity: Yes    Birth control/protection: None  Other Topics Concern   Not on file  Social History Narrative   Not on file   Social Drivers of Health   Financial Resource Strain: Not on file  Food Insecurity: Low Risk  (05/23/2023)   Received from [facility]   Hunger Vital Sign    Within the past 12 months, you worried that your food would run out before you got money to buy more: Never true    Within the past 12 months, the food you bought just didn't last and you didn't have money to get more. : Never true  Transportation Needs: No Transportation Needs (05/23/2023)   Received from [facility]    In the past 12 months, has lack of reliable transportation kept you from medical appointments, meetings, work or from getting things needed for daily living? : No  Physical Activity: Not on file  Stress: Not on file  Social Connections: Not on file     Family History: The patient's family history includes Heart failure in his mother.  EKGs/Labs/Other Studies Reviewed:    The following studies were reviewed today:  EKG Interpretation Date/Time:  Tuesday May 05 2024 11:10:10 EDT Ventricular Rate:  62 PR Interval:  182 QRS Duration:  82 QT Interval:  410 QTC Calculation: 416 R Axis:   11  Text Interpretation: Normal sinus rhythm Inferior infarct (cited on or before 11-Jun-2023) Anterolateral infarct (cited on or before 11-Jun-2023) When compared with ECG of 11-Jun-2023 13:31, T wave inversion now evident in Inferior leads Confirmed by Elaine Moloney [phone]) on 05/05/2024 1:04:29 PM    Recent Labs: 04/27/2024: BUN 10; Creatinine, Ser 1.32; NT-Pro BNP 6,407; Potassium 4.1; Sodium 137  Recent Lipid Panel    Component Value Date/Time   CHOL 134 09/24/2022 1301   TRIG 170 (H) 09/24/2022 1301   HDL 33 (L) 09/24/2022 1301   CHOLHDL 4.1 09/24/2022 1301    CHOLHDL 5.3 (H) 04/09/2016 1724   VLDL 42 (H) 04/09/2016 1724   LDLCALC 72 09/24/2022 1301     Physical Exam:    VS:  BP (!) 110/56   Pulse 62   Ht 5' 8 (1.727 m)   Wt 178 lb (80.7 kg)   SpO2 96%   BMI 27.06 kg/m        Wt Readings from Last 3 Encounters:  05/05/24 178 lb (80.7 kg)  03/09/24 175 lb 12.8 oz (79.7 kg)  02/05/24 183 lb 3.2 oz (83.1 kg)     GEN: Appears pale, ill. No acute distress HEENT: Normal NECK:  No carotid bruits CARDIAC:  S1-S2 normal, RRR, no murmurs, rubs, gallops RESPIRATORY: Few inspiratory rhonchi bilateral lower bases, otherwise clear to auscultation MUSCULOSKELETAL: 1+ bilateral pitting edema; No deformity  SKIN: Warm and dry NEUROLOGIC:  Alert and oriented x 3, responsive, answers questions appropriately, lingual dyskinesia at baseline PSYCHIATRIC:  Normal affect       Assessment & Plan Excessive sleepiness Seizure-like activity (HCC) HFrEF (heart failure with reduced ejection fraction) (HCC) Echocardiogram 10/16/2022: EF 45-50%, mildly depressed LV systolic function, hypokinetic global wall motion, G1 DD, normal LAP.  LA cavity mildly dilated, structurally normal mitral valve, grade 1 mitral regurgitation, structurally normal tricuspid valve, mild tricuspid regurgitation, RVSP measures 30 mmHg. Reports extreme fatigue/sleepiness.  Has slept all day the last 2 days, lack of appetite/not eating/drinking.  Spouse reports excessive paleness and increased confusion. Weight appears to be near recent baseline at 178 lbs in clinic Does not appear to be grossly volume overloaded, 1+ bilateral pitting edema, scant rhonchi bilateral bases proBNP 04/27/2024: 6407, recently increased due to Lasix  reduction Creatinine 04/27/2024: 1.32, recent improvement due to Lasix  reduction Very concerned about progression in neurologic symptoms with wide differential including undiagnosed neurologic issue, hypercarbia, hyperammonemia, low output state, etc. Have  recommended ED evaluation for further management, unclear whether any of this has any bearing on cardiac state For now, did not adjust any of his regimen, will be subject to adjustment pending ER evaluation Considering difficulty with balancing volume status and declining kidney function anticipate to refer patient to advanced heart failure clinic for further management of HFrEF, can discuss at next OV Discussed case with the DOD, Dr. Sheena, who agrees with current plan of ED referral. If admitted, anticipate this to be a hospitalist admission. Cardiology will be available if needed.  Regarding future HF workup, consideration could be given to infiltrative evaluation in follow-up. Coronary artery disease involving native coronary artery of native heart, unspecified whether angina present EKG: NSR, 62 bpm, inferior and anterolateral infarct (old), more pronounced T wave inversion in inferior leads Reports DOE, occasional palpitations, leg swelling Denies chest pain, near-syncope Continue aspirin  EC 81 mg daily Continue atorvastatin  40 mg daily Continue Toprol -XL 25 mg daily pending evaluation in ER Referring to emergency department for focal neurosymptoms and overall decompensation Primary hypertension Has not been checking BPs at home Advised to start home BP log with the new BP cuff they just bought No acute changes made today  Proceed to the emergency department for further evaluation Follow up with me next week in [place] location with Reche Finder, NP - patient/ex wife agreeable to location           Medication Adjustments/Labs and Tests Ordered: Current medicines are reviewed at length with the patient today.  Concerns regarding medicines are outlined above.  Orders Placed This Encounter  Procedures   EKG 12-Lead   No orders of the defined types were placed in this encounter.   Patient Instructions  Medication Instructions:  Your physician recommends that you continue  on your current medications as directed. Please refer to the Current Medication list given to you today.  *If you need a refill on your cardiac medications before your next appointment, please call your pharmacy*  Follow-Up: At [facility], you and your health needs are our priority.  As part of our continuing mission to provide you with exceptional heart care, our providers are all part of one team.  This team includes your primary Cardiologist (physician) and Advanced Practice Providers or APPs (Physician Assistants and Nurse Practitioners) who  all work together to provide you with the care you need, when you need it.  Your next appointment:   1 week(s)  Provider:   Miriam Shams, NP    We recommend signing up for the patient portal called MyChart.  Sign up information is provided on this After Visit Summary.  MyChart is used to connect with patients for Virtual Visits (Telemedicine).  Patients are able to view lab/test results, encounter notes, upcoming appointments, etc.  Non-urgent messages can be sent to your provider as well.   To learn more about what you can do with MyChart, go to [email or web address].   Other Instructions Your physician recommends you go to the emergency department       We are sending you to the emergency room for multiple concerns:  - sleeping more x 2 weeks (including all day yesterday on birthday) - poor appetite and poor oral intake - questionable seizure-like activity with episodes of blank staring, non-responsive, lasting several minutes at a time, followed by acute confusion upon return of responsiveness - increasingly confused/forgetful  - has never seen neurology or had neurologic imaging, no formal history of seizures   Signed, Miriam FORBES Shams, NP  05/05/2024 1:16 PM    [facility]

## 2024-05-05 ENCOUNTER — Emergency Department (HOSPITAL_COMMUNITY)

## 2024-05-05 ENCOUNTER — Encounter (HOSPITAL_COMMUNITY): Payer: Self-pay

## 2024-05-05 ENCOUNTER — Other Ambulatory Visit: Payer: Self-pay

## 2024-05-05 ENCOUNTER — Encounter: Payer: Self-pay | Admitting: Physician Assistant

## 2024-05-05 ENCOUNTER — Ambulatory Visit: Attending: Physician Assistant | Admitting: Emergency Medicine

## 2024-05-05 ENCOUNTER — Emergency Department (HOSPITAL_COMMUNITY)
Admission: EM | Admit: 2024-05-05 | Discharge: 2024-05-06 | Disposition: A | Discharge status: Home / Self Care | Attending: Emergency Medicine | Admitting: Emergency Medicine

## 2024-05-05 ENCOUNTER — Other Ambulatory Visit: Payer: Self-pay | Admitting: Internal Medicine

## 2024-05-05 VITALS — BP 110/56 | HR 62 | Ht 68.0 in | Wt 178.0 lb

## 2024-05-05 DIAGNOSIS — J449 Chronic obstructive pulmonary disease, unspecified: Secondary | ICD-10-CM | POA: Diagnosis not present

## 2024-05-05 DIAGNOSIS — R569 Unspecified convulsions: Secondary | ICD-10-CM

## 2024-05-05 DIAGNOSIS — I10 Essential (primary) hypertension: Secondary | ICD-10-CM

## 2024-05-05 DIAGNOSIS — Z79899 Other long term (current) drug therapy: Secondary | ICD-10-CM | POA: Insufficient documentation

## 2024-05-05 DIAGNOSIS — G4710 Hypersomnia, unspecified: Secondary | ICD-10-CM | POA: Diagnosis not present

## 2024-05-05 DIAGNOSIS — I5020 Unspecified systolic (congestive) heart failure: Secondary | ICD-10-CM | POA: Diagnosis not present

## 2024-05-05 DIAGNOSIS — I509 Heart failure, unspecified: Secondary | ICD-10-CM | POA: Diagnosis not present

## 2024-05-05 DIAGNOSIS — R5383 Other fatigue: Secondary | ICD-10-CM | POA: Diagnosis not present

## 2024-05-05 DIAGNOSIS — Z7982 Long term (current) use of aspirin: Secondary | ICD-10-CM | POA: Insufficient documentation

## 2024-05-05 DIAGNOSIS — Z720 Tobacco use: Secondary | ICD-10-CM | POA: Diagnosis not present

## 2024-05-05 DIAGNOSIS — I110 Hypertensive heart disease with heart failure: Secondary | ICD-10-CM | POA: Diagnosis not present

## 2024-05-05 DIAGNOSIS — I2510 Atherosclerotic heart disease of native coronary artery without angina pectoris: Secondary | ICD-10-CM

## 2024-05-05 LAB — CBC WITH DIFFERENTIAL/PLATELET
Abs Immature Granulocytes: 0.01 K/uL (ref 0.00–0.07)
Basophils Absolute: 0.1 K/uL (ref 0.0–0.1)
Basophils Relative: 1 %
Eosinophils Absolute: 0.5 K/uL (ref 0.0–0.5)
Eosinophils Relative: 5 %
HCT: 38.8 % — ABNORMAL LOW (ref 39.0–52.0)
Hemoglobin: 12.9 g/dL — ABNORMAL LOW (ref 13.0–17.0)
Immature Granulocytes: 0 %
Lymphocytes Relative: 26 %
Lymphs Abs: 2.4 K/uL (ref 0.7–4.0)
MCH: 33.1 pg (ref 26.0–34.0)
MCHC: 33.2 g/dL (ref 30.0–36.0)
MCV: 99.5 fL (ref 80.0–100.0)
Monocytes Absolute: 0.9 K/uL (ref 0.1–1.0)
Monocytes Relative: 9 %
Neutro Abs: 5.4 K/uL (ref 1.7–7.7)
Neutrophils Relative %: 59 %
Platelets: 234 K/uL (ref 150–400)
RBC: 3.9 MIL/uL — ABNORMAL LOW (ref 4.22–5.81)
RDW: 13.3 % (ref 11.5–15.5)
WBC: 9.2 K/uL (ref 4.0–10.5)
nRBC: 0 % (ref 0.0–0.2)

## 2024-05-05 LAB — I-STAT ARTERIAL BLOOD GAS, ED
Acid-Base Excess: 3 mmol/L — ABNORMAL HIGH (ref 0.0–2.0)
Bicarbonate: 28.2 mmol/L — ABNORMAL HIGH (ref 20.0–28.0)
Calcium, Ion: 1.14 mmol/L — ABNORMAL LOW (ref 1.15–1.40)
HCT: 36 % — ABNORMAL LOW (ref 39.0–52.0)
Hemoglobin: 12.2 g/dL — ABNORMAL LOW (ref 13.0–17.0)
O2 Saturation: 93 %
Patient temperature: 98.1
Potassium: 3.9 mmol/L (ref 3.5–5.1)
Sodium: 138 mmol/L (ref 135–145)
TCO2: 29 mmol/L (ref 22–32)
pCO2 arterial: 42.4 mmHg (ref 32–48)
pH, Arterial: 7.429 (ref 7.35–7.45)
pO2, Arterial: 63 mmHg — ABNORMAL LOW (ref 83–108)

## 2024-05-05 LAB — TROPONIN I (HIGH SENSITIVITY)
Troponin I (High Sensitivity): 12 ng/L (ref <18)
Troponin I (High Sensitivity): 13 ng/L (ref <18)

## 2024-05-05 LAB — BRAIN NATRIURETIC PEPTIDE: B Natriuretic Peptide: 308.3 pg/mL — ABNORMAL HIGH (ref 0.0–100.0)

## 2024-05-05 LAB — COMPREHENSIVE METABOLIC PANEL WITH GFR
ALT: 13 U/L (ref 0–44)
AST: 19 U/L (ref 15–41)
Albumin: 3.8 g/dL (ref 3.5–5.0)
Alkaline Phosphatase: 55 U/L (ref 38–126)
Anion gap: 10 (ref 5–15)
BUN: 18 mg/dL (ref 8–23)
CO2: 27 mmol/L (ref 22–32)
Calcium: 8.7 mg/dL — ABNORMAL LOW (ref 8.9–10.3)
Chloride: 100 mmol/L (ref 98–111)
Creatinine, Ser: 1.68 mg/dL — ABNORMAL HIGH (ref 0.61–1.24)
GFR, Estimated: 43 mL/min — ABNORMAL LOW (ref >60)
Glucose, Bld: 108 mg/dL — ABNORMAL HIGH (ref 70–99)
Potassium: 4.2 mmol/L (ref 3.5–5.1)
Sodium: 137 mmol/L (ref 135–145)
Total Bilirubin: 0.8 mg/dL (ref 0.0–1.2)
Total Protein: 7.3 g/dL (ref 6.5–8.1)

## 2024-05-05 LAB — MAGNESIUM: Magnesium: 1.9 mg/dL (ref 1.7–2.4)

## 2024-05-05 NOTE — Assessment & Plan Note (Deleted)
 Echocardiogram 10/16/2022: EF 45-50%, mildly depressed LV systolic function, hypokinetic global wall motion, G1 DD, normal LAP.  LA cavity mildly dilated, structurally normal mitral valve, grade 1 mitral regurgitation, structurally normal tricuspid valve, mild tricuspid regurgitation, RVSP measures 30 mmHg. Symptoms Weight Volume status proBNP 04/27/2024: 6407, recently increased due to Lasix  reduction Creatinine 04/27/2024: 1.32, recent improvement due to Lasix  reduction Continue Lasix  40 mg daily as needed for edema Continue Toprol -XL 25 mg daily Continue Entresto  49-51 mg twice daily Consider starting spironolactone  12.5 mg daily *** Will order CMP to evaluate kidney and liver function Will order proBNP to evaluate volume status Considering difficulty with balancing volume status and declining kidney function will refer patient to advanced heart failure clinic for further management of HFrEF

## 2024-05-05 NOTE — ED Provider Notes (Signed)
 [place] EMERGENCY DEPARTMENT AT [facility] Provider Note   CSN: [id number] Arrival date & time: 05/05/24  1538     Patient presents with: No chief complaint on file.   Logan Green is a 73 y.o. male.  {Add pertinent medical, surgical, social history, OB history to HPI:1216} 72 year old male with past medical history of CHF, COPD, rheumatic fever and hypertension presents with complaint of fatigue and loss of appetite.  Patient's ex-wife/roommate at bedside reports that for the past few weeks, progressively increase in fatigue, for the past 2 days has gotten out of bed in the morning and then gone back to bed for the day. Also reports for the past 8 months, has episodes where his eyes are wide open, eyes shifting and lip smacking and not answering questions. Episode lasts less than a min and patient confused afterwards. Patient was supposed to have outpatient testing for this but couldn't get insurance coverage and therefore never had the testing done. Patient went to cardiology follow up appointment today, was recommended to come to the ER with concern for retained CO2 vs ammonia elevated vs other causes for his change in mental status/fatigue. Denies CP, SHOB. States his ankles were swollen a few days ago, took his fluid pill and swelling improved. Denies fevers, recent illness, dark/bloody stools.  Drinks beer on occasion, smokes 1PPD, +THC use.        Prior to Admission medications   Medication Sig Start Date End Date Taking? Authorizing Provider  acetaminophen  (TYLENOL ) 500 MG tablet Take 1,000 mg by mouth every 6 (six) hours as needed for mild pain.    [provider]  albuterol (VENTOLIN HFA) 108 (90 Base) MCG/ACT inhaler Inhale 1 puff into the lungs daily as needed for wheezing or shortness of breath. 01/20/21   [provider]  aspirin  81 MG tablet Take 1 tablet (81 mg total) by mouth daily. 10/31/17   Christopher Savannah, PA-C  atorvastatin  (LIPITOR) 40  MG tablet Take 1 tablet (40 mg total) by mouth daily. 06/11/23   Patwardhan, Newman PARAS, MD  budesonide-formoterol (SYMBICORT) 160-4.5 MCG/ACT inhaler Inhale 2 puffs into the lungs. 01/22/24 05/05/24  [provider]  fluticasone (FLONASE) 50 MCG/ACT nasal spray Place 1 spray into both nostrils daily as needed for allergies.    [provider]  furosemide  (LASIX ) 40 MG tablet Take 1 tablet (40 mg total) by mouth daily as needed for edema. 04/13/24   Patwardhan, Newman PARAS, MD  gabapentin (NEURONTIN) 300 MG capsule TAKE 1 CAPSULE(300 MG) BY MOUTH TWICE DAILY AS NEEDED FOR NERVE PAIN Patient taking differently: Take 300 mg by mouth 2 (two) times daily. 09/06/23   [provider]  magnesium oxide (MAG-OX) 400 (240 Mg) MG tablet Take 1 tablet by mouth daily. 11/23/22   [provider]  metoprolol  succinate (TOPROL -XL) 25 MG 24 hr tablet Take 1 tablet (25 mg total) by mouth daily. 06/11/23   Patwardhan, Newman PARAS, MD  omeprazole  (PRILOSEC) 20 MG capsule Take 1 capsule by mouth daily. 10/15/22   [provider]  sacubitril -valsartan  (ENTRESTO ) 49-51 MG Take 1 tablet by mouth 2 (two) times daily. 03/09/24   Patwardhan, Newman PARAS, MD  spironolactone  (ALDACTONE ) 25 MG tablet Take 25 mg by mouth daily. 03/09/24   [provider]    Allergies: Patient has no known allergies.    Review of Systems Negative except as per HPI Updated Vital Signs BP (!) 148/95   Pulse 65   Temp 98.1 F (36.7  C)   Resp (!) 23   Ht 5' 8 (1.727 m)   Wt 80.7 kg   SpO2 100%   BMI 27.06 kg/m   Physical Exam Vitals and nursing note reviewed.  Constitutional:      General: He is not in acute distress.    Appearance: He is well-developed. He is not diaphoretic.  HENT:     Head: Normocephalic and atraumatic.     Mouth/Throat:     Mouth: Mucous membranes are dry.  Eyes:     Conjunctiva/sclera: Conjunctivae normal.  Cardiovascular:     Rate and Rhythm: Normal rate and regular  rhythm.     Heart sounds: Normal heart sounds.  Pulmonary:     Effort: Pulmonary effort is normal.     Breath sounds: Normal breath sounds.  Abdominal:     Palpations: Abdomen is soft.     Tenderness: There is no abdominal tenderness.  Musculoskeletal:     Right lower leg: No edema.     Left lower leg: No edema.  Skin:    General: Skin is warm and dry.  Neurological:     Mental Status: He is alert and oriented to person, place, and time.  Psychiatric:        Behavior: Behavior normal.     (all labs ordered are listed, but only abnormal results are displayed) Labs Reviewed  CBC WITH DIFFERENTIAL/PLATELET - Abnormal; Notable for the following components:      Result Value   RBC 3.90 (*)    Hemoglobin 12.9 (*)    HCT 38.8 (*)    All other components within normal limits  COMPREHENSIVE METABOLIC PANEL WITH GFR - Abnormal; Notable for the following components:   Glucose, Bld 108 (*)    Creatinine, Ser 1.68 (*)    Calcium  8.7 (*)    GFR, Estimated 43 (*)    All other components within normal limits  BRAIN NATRIURETIC PEPTIDE - Abnormal; Notable for the following components:   B Natriuretic Peptide 308.3 (*)    All other components within normal limits  SARS CORONAVIRUS 2 BY RT PCR  MAGNESIUM  AMMONIA  I-STAT ARTERIAL BLOOD GAS, ED  TROPONIN I (HIGH SENSITIVITY)  TROPONIN I (HIGH SENSITIVITY)    EKG: None  Radiology: DG Chest 2 View Result Date: 05/05/2024 EXAM: 2 VIEW(S) XRAY OF THE CHEST 05/05/2024 05:30:00 PM COMPARISON: 12 / 17 / 09 CLINICAL HISTORY: Shortness of breath. Patient in ED with multiple complaints. Patient has been having seizures, fatigue, dizziness, shortness of breath and no appetite that has progressively getting worse over the past 2-3 weeks. FINDINGS: LUNGS AND PLEURA: No focal pulmonary opacity. No pulmonary edema. No pleural effusion. No pneumothorax. HEART AND MEDIASTINUM: No acute abnormality of the cardiac and mediastinal silhouettes. BONES AND  SOFT TISSUES: Multilevel degenerative change of thoracic spine. Chronic wedging of multiple midthoracic vertebral bodies. IMPRESSION: 1. No acute cardiopulmonary process. Electronically signed by: Norman Gatlin MD 05/05/2024 05:56 PM EDT RP Workstation: [id number]   CT Head Wo Contrast Result Date: 05/05/2024 EXAM: CT HEAD WITHOUT CONTRAST 05/05/2024 05:22:00 PM TECHNIQUE: CT of the head was performed without the administration of intravenous contrast. Automated exposure control, iterative reconstruction, and/or weight based adjustment of the mA/kV was utilized to reduce the radiation dose to as low as reasonably achievable. COMPARISON: None available. CLINICAL HISTORY: Seizures, stumbling, trouble with speech, fatigue, dizziness, and decreased appetite. Patient was referred from cardiology for evaluation. FINDINGS: BRAIN AND VENTRICLES: Left frontal and parietal lobe encephalomalacia. Atherosclerotic calcifications  within the cavernous internal carotid arteries. No acute hemorrhage. No evidence of acute infarct. No hydrocephalus. No extra-axial collection. No mass effect or midline shift. ORBITS: No acute abnormality. SINUSES: No acute abnormality. SOFT TISSUES AND SKULL: No acute soft tissue abnormality. No skull fracture. IMPRESSION: 1. No acute intracranial abnormality. 2. Left frontal and parietal lobe encephalomalacia. Electronically signed by: Norman Gatlin MD 05/05/2024 05:40 PM EDT RP Workstation: [id number]    {Document cardiac monitor, telemetry assessment procedure when appropriate:32947} Procedures   Medications Ordered in the ED - No data to display    {Click here for ABCD2, HEART and other calculators REFRESH Note before signing:1}                              Medical Decision Making Amount and/or Complexity of Data Reviewed Labs: ordered.   ***  {Document critical care time when appropriate  Document review of labs and clinical decision tools ie CHADS2VASC2, etc  Document  your independent review of radiology images and any outside records  Document your discussion with family members, caretakers and with consultants  Document social determinants of health affecting pt's care  Document your decision making why or why not admission, treatments were needed:32947:::1}   Final diagnoses:  None    ED Discharge Orders     None

## 2024-05-05 NOTE — Patient Instructions (Addendum)
 Medication Instructions:  Your physician recommends that you continue on your current medications as directed. Please refer to the Current Medication list given to you today.  *If you need a refill on your cardiac medications before your next appointment, please call your pharmacy*  Follow-Up: At [facility], you and your health needs are our priority.  As part of our continuing mission to provide you with exceptional heart care, our providers are all part of one team.  This team includes your primary Cardiologist (physician) and Advanced Practice Providers or APPs (Physician Assistants and Nurse Practitioners) who all work together to provide you with the care you need, when you need it.  Your next appointment:   1 week(s)  Provider:   Miriam Shams, NP    We recommend signing up for the patient portal called MyChart.  Sign up information is provided on this After Visit Summary.  MyChart is used to connect with patients for Virtual Visits (Telemedicine).  Patients are able to view lab/test results, encounter notes, upcoming appointments, etc.  Non-urgent messages can be sent to your provider as well.   To learn more about what you can do with MyChart, go to [email or web address].   Other Instructions Your physician recommends you go to the emergency department       We are sending you to the emergency room for multiple concerns:  - sleeping more x 2 weeks (including all day yesterday on birthday) - poor appetite and poor oral intake - questionable seizure-like activity with episodes of blank staring, non-responsive, lasting several minutes at a time, followed by acute confusion upon return of responsiveness - increasingly confused/forgetful  - has never seen neurology or had neurologic imaging, no formal history of seizures

## 2024-05-05 NOTE — ED Provider Triage Note (Signed)
 Emergency Medicine Provider Triage Evaluation Note  Logan Green , a 73 y.o. male  was evaluated in triage.  Patient reports ongoing decreased appetite and fatigue that has been ongoing for a couple weeks.  He denies any new or worsening symptoms.  Caregiver at bedside also reports that he has seizures.  She says that he will get wide-eyed during his episodes and stare for about a minute.  He was seen by his cardiologist earlier today who wanted him to come to the ER for evaluation.  He denies any current chest pain or shortness of breath.  Review of Systems  Positive: As above Negative: As above  Physical Exam  BP 122/69 (BP Location: Right Arm)   Pulse 79   Temp 98.5 F (36.9 C) (Oral)   Resp 20   Ht 5' 8 (1.727 m)   Wt 80.7 kg   SpO2 98%   BMI 27.06 kg/m  Gen:   Awake, no distress   Resp:  Normal effort  MSK:   Moves extremities without difficulty    Medical Decision Making  Medically screening exam initiated at 5:14 PM.  Appropriate orders placed.  ACHERON SUGG was informed that the remainder of the evaluation will be completed by another provider, this initial triage assessment does not replace that evaluation, and the importance of remaining in the ED until their evaluation is complete.     Logan Palma, PA-C 05/05/24 1714

## 2024-05-05 NOTE — Assessment & Plan Note (Addendum)
 Echocardiogram 10/16/2022: EF 45-50%, mildly depressed LV systolic function, hypokinetic global wall motion, G1 DD, normal LAP.  LA cavity mildly dilated, structurally normal mitral valve, grade 1 mitral regurgitation, structurally normal tricuspid valve, mild tricuspid regurgitation, RVSP measures 30 mmHg. Reports extreme fatigue/sleepiness.  Has slept all day the last 2 days, lack of appetite/not eating/drinking.  Spouse reports excessive paleness and increased confusion. Weight appears to be near recent baseline at 178 lbs in clinic Does not appear to be grossly volume overloaded, 1+ bilateral pitting edema, scant rhonchi bilateral bases proBNP 04/27/2024: 6407, recently increased due to Lasix  reduction Creatinine 04/27/2024: 1.32, recent improvement due to Lasix  reduction Very concerned about progression in neurologic symptoms with wide differential including undiagnosed neurologic issue, hypercarbia, hyperammonemia, low output state, etc. Have recommended ED evaluation for further management, unclear whether any of this has any bearing on cardiac state For now, did not adjust any of his regimen, will be subject to adjustment pending ER evaluation Considering difficulty with balancing volume status and declining kidney function anticipate to refer patient to advanced heart failure clinic for further management of HFrEF, can discuss at next OV Discussed case with the DOD, Dr. Sheena, who agrees with current plan of ED referral. If admitted, anticipate this to be a hospitalist admission. Cardiology will be available if needed.  Regarding future HF workup, consideration could be given to infiltrative evaluation in follow-up.

## 2024-05-05 NOTE — ED Triage Notes (Signed)
 Patient in ED with multiple complaints. Patient has been having seizures, fatigue, dizziness, shob and no appetite that has progressively getting worse over the past 2-3 weeks.

## 2024-05-05 NOTE — Progress Notes (Deleted)
 ERROR

## 2024-05-06 DIAGNOSIS — R569 Unspecified convulsions: Secondary | ICD-10-CM | POA: Diagnosis not present

## 2024-05-06 LAB — SARS CORONAVIRUS 2 BY RT PCR: SARS Coronavirus 2 by RT PCR: NEGATIVE

## 2024-05-06 LAB — AMMONIA: Ammonia: 26 umol/L (ref 9–35)

## 2024-05-06 NOTE — Discharge Instructions (Addendum)
 Follow-up neurology.  A referral was placed tonight.  [place] [facility] should call to schedule this appointment.  Return to the ER for worsening or concerning symptoms otherwise follow-up with your primary care provider in the next 2 days.  Per [place] statutes, patients with seizures are not allowed to drive until they have been seizure-free for six months.  Other recommendations include using caution when using heavy equipment or power tools. Avoid working on ladders or at heights. Take showers instead of baths.  Do not swim alone.  Ensure the water temperature is not too high on the home water heater. Do not go swimming alone. Do not lock yourself in a room alone (i.e. bathroom). When caring for infants or small children, sit down when holding, feeding, or changing them to minimize risk of injury to the child in the event you have a seizure. Maintain good sleep hygiene. Avoid alcohol.  Also recommend adequate sleep, hydration, good diet and minimize stress.     During the Seizure   - First, ensure adequate ventilation and place patients on the floor on their left side  Loosen clothing around the neck and ensure the airway is patent. If the patient is clenching the teeth, do not force the mouth open with any object as this can cause severe damage - Remove all items from the surrounding that can be hazardous. The patient may be oblivious to what's happening and may not even know what he or she is doing. If the patient is confused and wandering, either gently guide him/her away and block access to outside areas - Reassure the individual and be comforting - Call 911. In most cases, the seizure ends before EMS arrives. However, there are cases when seizures may last over 3 to 5 minutes. Or the individual may have developed breathing difficulties or severe injuries. If a pregnant patient or a person with diabetes develops a seizure, it is prudent to call an ambulance. - Finally, if  the patient does not regain full consciousness, then call EMS. Most patients will remain confused for about 45 to 90 minutes after a seizure, so you must use judgment in calling for help. - Avoid restraints but make sure the patient is in a bed with padded side rails - Place the individual in a lateral position with the neck slightly flexed; this will help the saliva drain from the mouth and prevent the tongue from falling backward - Remove all nearby furniture and other hazards from the area - Provide verbal assurance as the individual is regaining consciousness - Provide the patient with privacy if possible - Call for help and start treatment as ordered by the caregiver   After the Seizure (Postictal Stage)   After a seizure, most patients experience confusion, fatigue, muscle pain and/or a headache. Thus, one should permit the individual to sleep. For the next few days, reassurance is essential. Being calm and helping reorient the person is also of importance.   Most seizures are painless and end spontaneously. Seizures are not harmful to others but can lead to complications such as stress on the lungs, brain and the heart. Individuals with prior lung problems may develop labored breathing and respiratory distress.

## 2024-05-11 NOTE — Progress Notes (Signed)
 MyChart message containing providers result note and interpretation read by patient on: Last read by Darina JAYSON Herring at 5:59PM on 05/08/2024.

## 2024-05-12 ENCOUNTER — Ambulatory Visit (HOSPITAL_BASED_OUTPATIENT_CLINIC_OR_DEPARTMENT_OTHER): Admitting: Family

## 2024-06-05 ENCOUNTER — Other Ambulatory Visit: Payer: Self-pay | Admitting: Cardiology

## 2024-06-26 ENCOUNTER — Telehealth: Payer: Self-pay | Admitting: Cardiology

## 2024-06-26 MED ORDER — SPIRONOLACTONE 25 MG PO TABS: 25.0000 mg | ORAL_TABLET | Freq: Every day | ORAL | 3 refills | Status: AC

## 2024-06-26 NOTE — Telephone Encounter (Signed)
°*  STAT* If patient is at the pharmacy, call can be transferred to refill team.   1. Which medications need to be refilled? (please list name of each medication and dose if known) spironolactone  (ALDACTONE ) 25 MG tablet    2. Would you like to learn more about the convenience, safety, & potential cost savings by using the [facility]? No    3. Are you open to using the [facility] (Type [facility].  ). No    4. Which pharmacy/location (including street and city if local pharmacy) is medication to be sent to?[id number] - [place], [place] - [address] AT [address]    5. Do they need a 30 day or 90 day supply? 90 day    Pt is out of medication

## 2024-06-26 NOTE — Telephone Encounter (Signed)
 Refill sent

## 2024-07-24 ENCOUNTER — Encounter: Payer: Self-pay | Admitting: Cardiology

## 2024-08-04 ENCOUNTER — Encounter: Payer: Self-pay | Admitting: Cardiology

## 2024-08-05 NOTE — Telephone Encounter (Signed)
 Agree with your recommendations above. If symptoms are cardiac but not acute (not requiring to go to the ER), I can see him on 2/2.  Thanks MJP

## 2024-08-06 NOTE — Telephone Encounter (Signed)
 Spoke with Niels (on DPR) and she reports that patient has been becoming more fatigued with worsening shortness of breath over the month. She reports that patient can be difficult at times to wake up as well. Admits to patient feeling weak and dizzy. Reports blood pressures have been okay. Symptoms have started worsening over the last month. Appt made for 08/07/24 at 1545.

## 2024-08-07 ENCOUNTER — Other Ambulatory Visit (HOSPITAL_COMMUNITY): Payer: Self-pay

## 2024-08-07 ENCOUNTER — Encounter: Payer: Self-pay | Admitting: Cardiology

## 2024-08-07 ENCOUNTER — Ambulatory Visit: Attending: Cardiology | Admitting: Cardiology

## 2024-08-07 VITALS — BP 149/78 | HR 59 | Resp 17 | Ht 68.0 in | Wt 185.0 lb

## 2024-08-07 DIAGNOSIS — G9389 Other specified disorders of brain: Secondary | ICD-10-CM

## 2024-08-07 DIAGNOSIS — G4710 Hypersomnia, unspecified: Secondary | ICD-10-CM | POA: Diagnosis present

## 2024-08-07 DIAGNOSIS — I2510 Atherosclerotic heart disease of native coronary artery without angina pectoris: Secondary | ICD-10-CM

## 2024-08-07 DIAGNOSIS — E782 Mixed hyperlipidemia: Secondary | ICD-10-CM | POA: Diagnosis present

## 2024-08-07 DIAGNOSIS — I10 Essential (primary) hypertension: Secondary | ICD-10-CM

## 2024-08-07 DIAGNOSIS — R2689 Other abnormalities of gait and mobility: Secondary | ICD-10-CM | POA: Diagnosis present

## 2024-08-07 DIAGNOSIS — I5020 Unspecified systolic (congestive) heart failure: Secondary | ICD-10-CM

## 2024-08-07 LAB — BASIC METABOLIC PANEL WITH GFR
BUN/Creatinine Ratio: 13 (ref 10–24)
BUN: 14 mg/dL (ref 8–27)
CO2: 25 mmol/L (ref 20–29)
Calcium: 9.2 mg/dL (ref 8.6–10.2)
Chloride: 99 mmol/L (ref 96–106)
Creatinine, Ser: 1.1 mg/dL (ref 0.76–1.27)
Glucose: 97 mg/dL (ref 70–99)
Potassium: 4.4 mmol/L (ref 3.5–5.2)
Sodium: 137 mmol/L (ref 134–144)
eGFR: 72 mL/min/1.73

## 2024-08-07 LAB — PRO B NATRIURETIC PEPTIDE: NT-Pro BNP: 921 pg/mL — ABNORMAL HIGH (ref 0–376)

## 2024-08-07 LAB — LIPID PANEL
Chol/HDL Ratio: 2.9 ratio (ref 0.0–5.0)
Cholesterol, Total: 117 mg/dL (ref 100–199)
HDL: 40 mg/dL
LDL Chol Calc (NIH): 59 mg/dL (ref 0–99)
Triglycerides: 97 mg/dL (ref 0–149)
VLDL Cholesterol Cal: 18 mg/dL (ref 5–40)

## 2024-08-07 MED ORDER — EMPAGLIFLOZIN 10 MG PO TABS
10.0000 mg | ORAL_TABLET | Freq: Every day | ORAL | 3 refills | Status: AC
  Filled 2024-08-07: qty 30, 30d supply, fill #0

## 2024-08-07 NOTE — Patient Instructions (Signed)
 Medication Instructions:  START Jardiance  10 mg daily   *If you need a refill on your cardiac medications before your next appointment, please call your pharmacy*  Lab Work: BMP LIPID PANEL  PROBNP  If you have labs (blood work) drawn today and your tests are completely normal, you will receive your results only by: MyChart Message (if you have MyChart) OR A paper copy in the mail If you have any lab test that is abnormal or we need to change your treatment, we will call you to review the results.  Testing/Procedures: ECHOCARDIOGRAM IN 3 MONTHS   Your physician has requested that you have an echocardiogram. Echocardiography is a painless test that uses sound waves to create images of your heart. It provides your doctor with information about the size and shape of your heart and how well your hearts chambers and valves are working. This procedure takes approximately one hour. There are no restrictions for this procedure. Please do NOT wear cologne, perfume, aftershave, or lotions (deodorant is allowed). Please arrive 15 minutes prior to your appointment time.  Please note: We ask at that you not bring children with you during ultrasound (echo/ vascular) testing. Due to room size and safety concerns, children are not allowed in the ultrasound rooms during exams. Our front office staff cannot provide observation of children in our lobby area while testing is being conducted. An adult accompanying a patient to their appointment will only be allowed in the ultrasound room at the discretion of the ultrasound technician under special circumstances. We apologize for any inconvenience.   REFERRAL TO NEUROLOGY    Follow-Up: At [facility], you and your health needs are our priority.  As part of our continuing mission to provide you with exceptional heart care, our providers are all part of one team.  This team includes your primary Cardiologist (physician) and Advanced Practice Providers  or APPs (Physician Assistants and Nurse Practitioners) who all work together to provide you with the care you need, when you need it.  Your next appointment:   3-4 WEEKS APP OR PHARM-D    3 MONTHS WITH DR. PATWARDHAN     We recommend signing up for the patient portal called MyChart.  Sign up information is provided on this After Visit Summary.  MyChart is used to connect with patients for Virtual Visits (Telemedicine).  Patients are able to view lab/test results, encounter notes, upcoming appointments, etc.  Non-urgent messages can be sent to your provider as well.   To learn more about what you can do with MyChart, go to [email or web address].   Other Instructions

## 2024-08-07 NOTE — Progress Notes (Signed)
 " Cardiology Office Note:  .   Date:  08/07/2024  ID:  Logan Green, DOB 11-29-1950, MRN [id number] PCP: Loris Elsie [name]  [facility] Providers Cardiologist:  Newman Lawrence, MD PCP: Loris Elsie [name], PA-C  Chief Complaint  Patient presents with   HFrEF      History of Present Illness: .    Logan Green is a 74 y.o. male with hypertension, prediabetes, CAD with RCA CTO, HFrEF   Patient was last seen by me 6 months ago.  He is here with his spouse.  He continues to have exertional dyspnea with minimal physical activity, but denies any chest pain or significant leg edema symptoms.  His spouse is worried about episodes where he has periods of confusion, blank stare, lip smacking with eyes wide open, not responding to her questions.  Patient does not particularly recollect these episodes..  He has balance issues with multiple falls.  These symptoms have been present for a while.  Patient was seen with similar symptoms in emergency room after being sent from cardiology office back in 04/2024.  BMP and proBNP were checked at his last visit on 813/2025.  Potassium was elevated at 5.5, proBNP was elevated at 1593.  I added Lasix  40 mg daily and repeated BMP which showed potassium of 5.4  Patient is doing well, denies any overt dyspnea, orthopnea, PND symptoms.  There were no vitals filed for this visit.      ROS:  Review of Systems  Cardiovascular:  Negative for chest pain, dyspnea on exertion, leg swelling, palpitations and syncope.  Neurological:  Positive for excessive daytime sleepiness.       Episodes as described in HPI     Studies Reviewed: SABRA        EKG 08/07/2024: Sinus bradycardia Inferior infarct , age undetermined When compared with ECG of 05-May-2024 22:40, No significant change was found    CT head 04/2024: CT head at that time showed left frontal and parietal lobe and cephalomalacia.  Echocardiogram 02/2024:  1. Left ventricular  ejection fraction, by estimation, is 40 to 45%. The left ventricle has mildly decreased function. Left ventricular endocardial border not optimally defined to evaluate regional wall motion. Left ventricular diastolic parameters are  consistent with Grade I diastolic dysfunction (impaired relaxation).  2. Right ventricular systolic function is normal. The right ventricular size is normal.  3. The mitral valve is grossly normal. No evidence of mitral valve regurgitation. No evidence of mitral stenosis.  4. The aortic valve was not well visualized. Aortic valve regurgitation is not visualized. No aortic stenosis is present.  5. The inferior vena cava is normal in size with greater than 50% respiratory variability, suggesting right atrial pressure of 3 mmHg.   Comparison(s): Similar to 2024 report.  Coronary angiography 2022: LM: Normal LAD: Prox and mid medial calcification with mid focal 30% stenosis Lcx: Small, normal RCA: Mid 100% occlusion         Faint left-to-right collaterals filling diffusely disease PDA and PLA vessels   Normal filling pressures   Well compensated cardiomyopathy, likely nonischemic. Continue GDMT for HFrEF If exertional dyspnea persists in spite of optimal treatment for HFrEF, could consider MRI to assess viability in chronically occluded RCA territory.   Labs 04/2024: Hb 12.2 Cr 1.68, eGFR 43 BNP 308, proBNP 6407  01/2024: Chol 109, TG 93, HDL 43, LDL 48 HbA1C 5.5% Cr 1.64, K 5.4 Mg 1.6   Physical Exam:   Physical Exam Vitals and  nursing note reviewed.  Constitutional:      General: He is not in acute distress. Neck:     Vascular: No JVD.  Cardiovascular:     Rate and Rhythm: Normal rate and regular rhythm.     Heart sounds: Normal heart sounds. No murmur heard. Pulmonary:     Effort: Pulmonary effort is normal.     Breath sounds: Normal breath sounds. No wheezing or rales.  Musculoskeletal:     Right lower leg: No edema.     Left lower leg: No  edema.      VISIT DIAGNOSES:   ICD-10-CM   1. HFrEF (heart failure with reduced ejection fraction) (HCC)  I50.20 EKG 12-Lead    2. Coronary artery disease involving native coronary artery of native heart, unspecified whether angina present  I25.10 EKG 12-Lead    3. Primary hypertension  I10 EKG 12-Lead        ASSESSMENT AND PLAN: .    Logan Green is a 74 y.o. male with hypertension, prediabetes, CAD with RCA CTO, HFrEF, tobacco dependence, prior history of alcohol abuse.     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 Stable class II dyspnea, chest pain euvolemic on exam.  Blood pressure is elevated. Add Jardiance  10 mg daily.   Continue Entresto  49-51 mg twice daily, metoprolol  succinate 25 mg daily. Encourage liberal hydration. Check BMP, proBNP, lipid panel. Follow-up with Pharm.D. for GDMT titration in 3-4 weeks. Check echocardiogram in 3 months.  CAD: Known RCA CTO.  No angina symptoms.  Continue aspirin , statin.  Lipids are well-controlled.  His episodes of confusion, lipsmacking, without any focal neurological deficits, CT head showing encephalomalacia, will need further evaluation.  Consider neurology referral.    Meds ordered this encounter  Medications   empagliflozin  (JARDIANCE ) 10 MG TABS tablet    Sig: Take 1 tablet (10 mg total) by mouth daily before breakfast.    Dispense:  90 tablet    Refill:  3   F/u w/ Pharm.D. or APP in 3-4 weeks, follow-up with me in 3 months  Signed, Newman [name] Lawrence, MD  "

## 2024-08-08 ENCOUNTER — Ambulatory Visit: Payer: Self-pay | Admitting: Cardiology

## 2024-08-08 ENCOUNTER — Encounter: Payer: Self-pay | Admitting: Cardiology

## 2024-09-18 ENCOUNTER — Ambulatory Visit

## 2024-09-18 ENCOUNTER — Ambulatory Visit: Admitting: Cardiology

## 2024-09-25 ENCOUNTER — Ambulatory Visit: Admitting: Cardiology

## 2024-11-04 ENCOUNTER — Ambulatory Visit (HOSPITAL_COMMUNITY)

## 2024-12-02 ENCOUNTER — Ambulatory Visit: Admitting: Neurology
